# Patient Record
Sex: Male | Born: 1975 | Race: Black or African American | Hispanic: No | Marital: Single | Smoking: Never smoker
Health system: Southern US, Community
[De-identification: ages and names within clinical notes are randomized; demographics above are authoritative.]

## PROBLEM LIST (undated history)

## (undated) DIAGNOSIS — Z8619 Personal history of other infectious and parasitic diseases: Secondary | ICD-10-CM

## (undated) DIAGNOSIS — B351 Tinea unguium: Secondary | ICD-10-CM

## (undated) DIAGNOSIS — Z87898 Personal history of other specified conditions: Secondary | ICD-10-CM

## (undated) HISTORY — DX: Tinea unguium: B35.1

## (undated) HISTORY — PX: VASECTOMY: SHX75

## (undated) HISTORY — DX: Personal history of other infectious and parasitic diseases: Z86.19

## (undated) HISTORY — DX: Personal history of other specified conditions: Z87.898

## (undated) HISTORY — PX: ANKLE SURGERY: SHX546

---

## 2005-07-18 ENCOUNTER — Emergency Department (HOSPITAL_COMMUNITY): Admission: EM | Admit: 2005-07-18 | Discharge: 2005-07-18 | Payer: Self-pay | Admitting: Emergency Medicine

## 2006-02-27 ENCOUNTER — Emergency Department (HOSPITAL_COMMUNITY): Admission: EM | Admit: 2006-02-27 | Discharge: 2006-02-27 | Payer: Self-pay | Admitting: Emergency Medicine

## 2007-03-05 DIAGNOSIS — Z87898 Personal history of other specified conditions: Secondary | ICD-10-CM

## 2007-03-05 HISTORY — DX: Personal history of other specified conditions: Z87.898

## 2007-04-14 ENCOUNTER — Emergency Department (HOSPITAL_COMMUNITY): Admission: EM | Admit: 2007-04-14 | Discharge: 2007-04-14 | Payer: Self-pay | Admitting: Emergency Medicine

## 2007-08-15 ENCOUNTER — Emergency Department (HOSPITAL_COMMUNITY): Admission: EM | Admit: 2007-08-15 | Discharge: 2007-08-15 | Payer: Self-pay | Admitting: Emergency Medicine

## 2007-10-16 ENCOUNTER — Ambulatory Visit (HOSPITAL_COMMUNITY): Admission: RE | Admit: 2007-10-16 | Discharge: 2007-10-16 | Payer: Self-pay | Admitting: Family Medicine

## 2007-10-16 ENCOUNTER — Ambulatory Visit: Payer: Self-pay | Admitting: Family Medicine

## 2007-10-16 LAB — CONVERTED CEMR LAB
Albumin: 5.1 g/dL (ref 3.5–5.2)
BUN: 14 mg/dL (ref 6–23)
CO2: 27 meq/L (ref 19–32)
Calcium: 9.7 mg/dL (ref 8.4–10.5)
Chloride: 102 meq/L (ref 96–112)
Cholesterol: 158 mg/dL (ref 0–200)
Eosinophils Relative: 5 % (ref 0–5)
Glucose, Bld: 85 mg/dL (ref 70–99)
HCT: 47.2 % (ref 39.0–52.0)
HDL: 54 mg/dL (ref >39)
MCHC: 34.5 g/dL (ref 30.0–36.0)
MCV: 86.8 fL (ref 78.0–100.0)
Monocytes Absolute: 0.3 10*3/uL (ref 0.1–1.0)
Neutro Abs: 1.9 10*3/uL (ref 1.7–7.7)
Neutrophils Relative %: 49 % (ref 43–77)
Platelets: 185 10*3/uL (ref 150–400)
RBC: 5.44 M/uL (ref 4.22–5.81)
RDW: 12.4 % (ref 11.5–15.5)
Sodium: 139 meq/L (ref 135–145)
TSH: 0.394 microintl units/mL (ref 0.350–4.50)
Total Bilirubin: 3.1 mg/dL — ABNORMAL HIGH (ref 0.3–1.2)
Triglycerides: 51 mg/dL (ref <150)
VLDL: 10 mg/dL (ref 0–40)

## 2007-10-19 ENCOUNTER — Ambulatory Visit: Payer: Self-pay | Admitting: *Deleted

## 2008-08-05 ENCOUNTER — Emergency Department (HOSPITAL_COMMUNITY): Admission: EM | Admit: 2008-08-05 | Discharge: 2008-08-05 | Payer: Self-pay | Admitting: Emergency Medicine

## 2008-08-07 ENCOUNTER — Emergency Department (HOSPITAL_COMMUNITY): Admission: EM | Admit: 2008-08-07 | Discharge: 2008-08-07 | Payer: Self-pay | Admitting: Emergency Medicine

## 2010-06-11 LAB — DIFFERENTIAL
Basophils Absolute: 0 10*3/uL (ref 0.0–0.1)
Eosinophils Relative: 0 % (ref 0–5)
Monocytes Relative: 5 % (ref 3–12)

## 2010-06-11 LAB — COMPREHENSIVE METABOLIC PANEL
AST: 31 U/L (ref 0–37)
BUN: 11 mg/dL (ref 6–23)
CO2: 30 mEq/L (ref 19–32)
Calcium: 9.6 mg/dL (ref 8.4–10.5)
GFR calc Af Amer: >60 mL/min (ref >60)
Glucose, Bld: 95 mg/dL (ref 70–99)
Total Bilirubin: 1.8 mg/dL — ABNORMAL HIGH (ref 0.3–1.2)
Total Protein: 7.5 g/dL (ref 6.0–8.3)

## 2010-06-11 LAB — CBC
MCHC: 33.9 g/dL (ref 30.0–36.0)
Platelets: 167 10*3/uL (ref 150–400)
RBC: 5.01 MIL/uL (ref 4.22–5.81)

## 2010-10-28 ENCOUNTER — Emergency Department (HOSPITAL_COMMUNITY)
Admission: EM | Admit: 2010-10-28 | Discharge: 2010-10-28 | Disposition: A | Discharge status: Home / Self Care | Payer: Self-pay | Attending: Emergency Medicine | Admitting: Emergency Medicine

## 2010-10-28 DIAGNOSIS — M545 Low back pain, unspecified: Secondary | ICD-10-CM | POA: Insufficient documentation

## 2010-10-28 DIAGNOSIS — R1031 Right lower quadrant pain: Secondary | ICD-10-CM | POA: Insufficient documentation

## 2010-10-28 LAB — URINALYSIS, ROUTINE W REFLEX MICROSCOPIC
Ketones, ur: 15 mg/dL — AB
Protein, ur: NEGATIVE mg/dL
pH: 6.5 (ref 5.0–8.0)

## 2013-10-24 ENCOUNTER — Emergency Department (HOSPITAL_COMMUNITY)
Admission: EM | Admit: 2013-10-24 | Discharge: 2013-10-24 | Disposition: A | Discharge status: Home / Self Care | Payer: Self-pay | Attending: Emergency Medicine | Admitting: Emergency Medicine

## 2013-10-24 ENCOUNTER — Emergency Department (HOSPITAL_COMMUNITY): Payer: Self-pay

## 2013-10-24 ENCOUNTER — Encounter (HOSPITAL_COMMUNITY): Payer: Self-pay | Admitting: Emergency Medicine

## 2013-10-24 DIAGNOSIS — Z9852 Vasectomy status: Secondary | ICD-10-CM | POA: Insufficient documentation

## 2013-10-24 DIAGNOSIS — R109 Unspecified abdominal pain: Secondary | ICD-10-CM | POA: Insufficient documentation

## 2013-10-24 DIAGNOSIS — I861 Scrotal varices: Secondary | ICD-10-CM | POA: Insufficient documentation

## 2013-10-24 DIAGNOSIS — Z9089 Acquired absence of other organs: Secondary | ICD-10-CM | POA: Insufficient documentation

## 2013-10-24 DIAGNOSIS — Z791 Long term (current) use of non-steroidal anti-inflammatories (NSAID): Secondary | ICD-10-CM | POA: Insufficient documentation

## 2013-10-24 LAB — I-STAT CHEM 8, ED
BUN: 9 mg/dL (ref 6–23)
CHLORIDE: 106 meq/L (ref 96–112)
Calcium, Ion: 1.17 mmol/L (ref 1.12–1.23)
Creatinine, Ser: 1.1 mg/dL (ref 0.50–1.35)
Glucose, Bld: 75 mg/dL (ref 70–99)
HCT: 48 % (ref 39.0–52.0)
HEMOGLOBIN: 16.3 g/dL (ref 13.0–17.0)
POTASSIUM: 3.8 meq/L (ref 3.7–5.3)
SODIUM: 139 meq/L (ref 137–147)
TCO2: 28 mmol/L (ref 0–100)

## 2013-10-24 LAB — URINALYSIS, ROUTINE W REFLEX MICROSCOPIC
BILIRUBIN URINE: NEGATIVE
GLUCOSE, UA: NEGATIVE mg/dL
Hgb urine dipstick: NEGATIVE
KETONES UR: NEGATIVE mg/dL
LEUKOCYTES UA: NEGATIVE
NITRITE: NEGATIVE
Protein, ur: NEGATIVE mg/dL
Specific Gravity, Urine: 1.024 (ref 1.005–1.030)
UROBILINOGEN UA: 1 mg/dL (ref 0.0–1.0)
pH: 8 (ref 5.0–8.0)

## 2013-10-24 LAB — RPR

## 2013-10-24 LAB — HIV ANTIBODY (ROUTINE TESTING W REFLEX): HIV 1&2 Ab, 4th Generation: NONREACTIVE

## 2013-10-24 MED ORDER — TRAMADOL HCL 50 MG PO TABS: 50.0000 mg | ORAL_TABLET | Freq: Four times a day (QID) | ORAL | Status: DC | PRN

## 2013-10-24 NOTE — ED Notes (Signed)
PA Tiffany at the bedside.  

## 2013-10-24 NOTE — Discharge Instructions (Signed)
Scrotal Swelling Scrotal swelling may occur on one or both sides of the scrotum. Pain may also occur with swelling. Possible causes of scrotal swelling include:   Injury.  Infection.  An ingrown hair or abrasion in the area.  Repeated rubbing from tight-fitting underwear.  Poor hygiene.  A weakened area in the muscles around the groin (hernia). A hernia can allow abdominal contents to push into the scrotum.  Fluid around the testicle (hydrocele).  Enlarged vein around the testicle (varicocele).  Certain medical treatments or existing conditions.  A recent genital surgery or procedure.  The spermatic cord becomes twisted in the scrotum, which cuts off blood supply (testicular torsion).  Testicular cancer. HOME CARE INSTRUCTIONS Once the cause of your scrotal swelling has been determined, you may be asked to monitor your scrotum for any changes. The following actions may help to alleviate any discomfort you are experiencing:  Rest and limit activity until the swelling goes away. Lying down is the preferred position.  Put ice on the scrotum:  Put ice in a plastic bag.  Place a towel between your skin and the bag.  Leave the ice on for 20 minutes, 2-3 times a day for 1-2 days.  Place a rolled towel under the testicles for support.  Wear loose-fitting clothing or an athletic support cup for comfort.  Take all medicines as directed by your health care provider.  Perform a monthly self-exam of the scrotum and penis. Feel for changes. Ask your health care provider how to perform a monthly self-exam if you are unsure. SEEK MEDICAL CARE IF:  You have a sudden (acute) onset of pain that is persistent and not improving.  You notice a heavy feeling or fluid in the scrotum.  You have pain or burning while urinating.  You have blood in the urine or semen.  You feel a lump around the testicle.  You notice that one testicle is larger than the other (slight variation is  normal).  You have a persistent dull ache or pain in the groin or scrotum. SEEK IMMEDIATE MEDICAL CARE IF:  The pain does not go away or becomes severe.  You have a fever or shaking chills.  You have pain or vomiting that cannot be controlled.  You notice significant redness or swelling of one or both sides of the scrotum.  You experience redness spreading upward from your scrotum to your abdomen or downward from your scrotum to your thighs. MAKE SURE YOU:  Understand these instructions.  Will watch your condition.  Will get help right away if you are not doing well or get worse. Document Released: 03/23/2010 Document Revised: 10/21/2012 Document Reviewed: 07/23/2012 Exit[employer] Patient Information 2015 [employer]. This information is not intended to replace advice given to you by your health care provider. Make sure you discuss any questions you have with your health care provider.  

## 2013-10-24 NOTE — ED Notes (Signed)
Patient returned from US.

## 2013-10-24 NOTE — ED Provider Notes (Signed)
CSN: [id number]     Arrival date & time 10/24/13  1033 History   First MD Initiated Contact with Patient 10/24/13 1124     Chief Complaint  Patient presents with  . Abdominal Pain  . Groin Pain     (Consider location/radiation/quality/duration/timing/severity/associated sxs/prior Treatment) HPI  Patient to the ER with complaints of left lower suprapubic and testicular pain with swelling that started 4 days ago. He reports having a vasectomy 3 years ago and the pain being near the area of the surgery. It has been constant and does not wax of wane. He is sexually active but does not want to comment on if he uses protection. He has had urinary hesitancy. No penile discharge, rectal pain, hematuria. No fevers, nausea, vomiting, or diarrhea. Denies hx of testicular injury, torsion, orchitis or epididymis   History reviewed. No pertinent past medical history. Past Surgical History  Procedure Laterality Date  . Vasectomy     No family history on file. History  Substance Use Topics  . Smoking status: Never Smoker   . Smokeless tobacco: Not on file  . Alcohol Use: No    Review of Systems   Review of Systems  Gen: no weight loss, fevers, chills, night sweats  Eyes: no occular draining, occular pain,  No visual changes  Nose: no epistaxis or rhinorrhea  Mouth: no dental pain, no sore throat  Neck: no neck pain  Lungs: No hemoptysis. No wheezing or coughing CV:  No palpitations, dependent edema or orthopnea. No chest pain Abd: no diarrhea. No nausea or vomiting, No abdominal pain  GU: no dysuria or gross hematuria, + left testicular pain and swelling, urinary hesitancy. MSK:  No muscle weakness, No muscular pain Neuro: no headache, no focal neurologic deficits  Skin: no rash , no wounds Psyche: no complaints of depression or anxiety    Allergies  Review of patient's allergies indicates no known allergies.  Home Medications   Prior to Admission medications   Medication Sig  Start Date End Date Taking? Authorizing Provider  ibuprofen (ADVIL,MOTRIN) 200 MG tablet Take 400 mg by mouth every 6 (six) hours as needed for mild pain.   Yes Historical Provider, MD  traMADol (ULTRAM) 50 MG tablet Take 1 tablet (50 mg total) by mouth every 6 (six) hours as needed. 10/24/13   Ronnie Mallette Marilu Favre, PA-C   BP 115/72  Pulse 71  Temp(Src) 98.2 F (36.8 C) (Oral)  Resp 15  SpO2 98% Physical Exam  Nursing note and vitals reviewed. Constitutional: He appears well-developed and well-nourished. No distress.  HENT:  Head: Normocephalic and atraumatic.  Eyes: Pupils are equal, round, and reactive to light.  Neck: Normal range of motion. Neck supple.  Cardiovascular: Normal rate and regular rhythm.   Pulmonary/Chest: Effort normal.  Abdominal: Soft. Bowel sounds are normal. He exhibits no distension and no fluid wave. There is no hepatomegaly. There is no tenderness. There is no rebound and no CVA tenderness. No hernia. Hernia confirmed negative in the left inguinal area.    Genitourinary: Penis normal. Right testis shows no mass, no swelling and no tenderness. Right testis is descended. Cremasteric reflex is not absent on the right side. Left testis shows swelling and tenderness. Left testis shows no mass. Left testis is descended. Cremasteric reflex is not absent on the left side. Circumcised. No penile erythema. No discharge found.  Lymphadenopathy:       Left: No inguinal adenopathy present.  Neurological: He is alert.  Skin: Skin is warm  and dry.    ED Course  Procedures (including critical care time) Labs Review Labs Reviewed  URINALYSIS, ROUTINE W REFLEX MICROSCOPIC - Abnormal; Notable for the following:    APPearance CLOUDY (*)    All other components within normal limits  GC/CHLAMYDIA PROBE AMP  HIV ANTIBODY (ROUTINE TESTING)  RPR  I-STAT CHEM 8, ED    Imaging Review US Scrotum  10/24/2013   CLINICAL DATA:  Left testicular pain and swelling.  EXAM: SCROTAL  ULTRASOUND  DOPPLER ULTRASOUND OF THE TESTICLES  TECHNIQUE: Complete ultrasound examination of the testicles, epididymis, and other scrotal structures was performed. Color and spectral Doppler ultrasound were also utilized to evaluate blood flow to the testicles.  COMPARISON:  None.  FINDINGS: Right testicle  Measurements: 4.2 x 4.1 x 2.5 cm. No mass or microlithiasis visualized.  Left testicle  Measurements: 4.6 x 3.3 x 2.1 cm. No mass or microlithiasis visualized.  Right epididymis:  Normal in size and appearance.  Left epididymis:  Normal in size and appearance.  Hydrocele:  Small bilateral hydroceles are noted.  Varicocele:  Moderate left varicocele is noted.  Pulsed Doppler interrogation of both testes demonstrates low resistance arterial and venous waveforms bilaterally.  IMPRESSION: Moderate left varicocele is noted. Small bilateral hydroceles are noted. No evidence of testicular torsion or mass is noted.   Electronically Signed   By: Sabino Dick M.D.   On: 10/24/2013 13:18   [place] Art/ven Flow Abd Pelv Doppler  10/24/2013   CLINICAL DATA:  Left testicular pain and swelling.  EXAM: SCROTAL ULTRASOUND  DOPPLER ULTRASOUND OF THE TESTICLES  TECHNIQUE: Complete ultrasound examination of the testicles, epididymis, and other scrotal structures was performed. Color and spectral Doppler ultrasound were also utilized to evaluate blood flow to the testicles.  COMPARISON:  None.  FINDINGS: Right testicle  Measurements: 4.2 x 4.1 x 2.5 cm. No mass or microlithiasis visualized.  Left testicle  Measurements: 4.6 x 3.3 x 2.1 cm. No mass or microlithiasis visualized.  Right epididymis:  Normal in size and appearance.  Left epididymis:  Normal in size and appearance.  Hydrocele:  Small bilateral hydroceles are noted.  Varicocele:  Moderate left varicocele is noted.  Pulsed Doppler interrogation of both testes demonstrates low resistance arterial and venous waveforms bilaterally.  IMPRESSION: Moderate left varicocele is  noted. Small bilateral hydroceles are noted. No evidence of testicular torsion or mass is noted.   Electronically Signed   By: Sabino Dick M.D.   On: 10/24/2013 13:18     EKG Interpretation None      MDM   Final diagnoses:  Varicocele present on ultrasound of scrotum    US shows Varicocele but without any signs of infection. He has a urologist who he plans to see tomorrow. Will tx for pain and have him follow-up. GC cultures, HIV and RPR pending. Pt declined pain medication during his visit as he reports his discomfort is mild.  38 y.o.Rebecca Duzan's evaluation in the Emergency Department is complete. It has been determined that no acute conditions requiring further emergency intervention are present at this time. The patient/guardian have been advised of the diagnosis and plan. We have discussed signs and symptoms that warrant return to the ED, such as changes or worsening in symptoms.  Vital signs are stable at discharge. Filed Vitals:   10/24/13 1330  BP: 115/72  Pulse: 71  Temp:   Resp: 15    Patient/guardian has voiced understanding and agreed to follow-up with the PCP or specialist.  Linus Mako, PA-C 10/24/13 1343

## 2013-10-24 NOTE — ED Provider Notes (Signed)
Medical screening examination/treatment/procedure(s) were performed by non-physician practitioner and as supervising physician I was immediately available for consultation/collaboration.   EKG Interpretation None       Threasa Beards, MD 10/24/13 1351

## 2013-10-24 NOTE — ED Notes (Signed)
Pt is here with left lower abdominal pain that radiates into testicles, reports testicle tenderness.  Pt feels like he needs to urinate but does not.  No back pain

## 2013-10-26 LAB — GC/CHLAMYDIA PROBE AMP
CT PROBE, AMP APTIMA: NEGATIVE
GC PROBE AMP APTIMA: NEGATIVE

## 2014-05-15 ENCOUNTER — Emergency Department (HOSPITAL_COMMUNITY)
Admission: EM | Admit: 2014-05-15 | Discharge: 2014-05-15 | Disposition: A | Discharge status: Home / Self Care | Payer: Self-pay | Attending: Emergency Medicine | Admitting: Emergency Medicine

## 2014-05-15 ENCOUNTER — Encounter (HOSPITAL_COMMUNITY): Payer: Self-pay | Admitting: *Deleted

## 2014-05-15 DIAGNOSIS — Y998 Other external cause status: Secondary | ICD-10-CM | POA: Insufficient documentation

## 2014-05-15 DIAGNOSIS — S3992XA Unspecified injury of lower back, initial encounter: Secondary | ICD-10-CM | POA: Insufficient documentation

## 2014-05-15 DIAGNOSIS — Y9389 Activity, other specified: Secondary | ICD-10-CM | POA: Insufficient documentation

## 2014-05-15 DIAGNOSIS — S4990XA Unspecified injury of shoulder and upper arm, unspecified arm, initial encounter: Secondary | ICD-10-CM | POA: Insufficient documentation

## 2014-05-15 DIAGNOSIS — Z79899 Other long term (current) drug therapy: Secondary | ICD-10-CM | POA: Insufficient documentation

## 2014-05-15 DIAGNOSIS — S199XXA Unspecified injury of neck, initial encounter: Secondary | ICD-10-CM | POA: Insufficient documentation

## 2014-05-15 DIAGNOSIS — M436 Torticollis: Secondary | ICD-10-CM

## 2014-05-15 DIAGNOSIS — Y92410 Unspecified street and highway as the place of occurrence of the external cause: Secondary | ICD-10-CM | POA: Insufficient documentation

## 2014-05-15 MED ORDER — NAPROXEN 500 MG PO TABS: 500.0000 mg | ORAL_TABLET | Freq: Two times a day (BID) | ORAL | Status: DC

## 2014-05-15 MED ORDER — CYCLOBENZAPRINE HCL 10 MG PO TABS: 10.0000 mg | ORAL_TABLET | Freq: Two times a day (BID) | ORAL | Status: DC | PRN
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
## 2014-06-14 ENCOUNTER — Other Ambulatory Visit: Payer: Self-pay | Admitting: Family Medicine

## 2014-06-14 DIAGNOSIS — R229 Localized swelling, mass and lump, unspecified: Principal | ICD-10-CM

## 2014-06-14 DIAGNOSIS — IMO0002 Reserved for concepts with insufficient information to code with codable children: Secondary | ICD-10-CM

## 2014-07-01 ENCOUNTER — Ambulatory Visit
Admission: RE | Admit: 2014-07-01 | Discharge: 2014-07-01 | Disposition: A | Discharge status: Home / Self Care | Payer: Managed Care, Other (non HMO) | Source: Ambulatory Visit | Attending: Family Medicine | Admitting: Family Medicine

## 2014-07-01 DIAGNOSIS — IMO0002 Reserved for concepts with insufficient information to code with codable children: Secondary | ICD-10-CM

## 2014-07-01 DIAGNOSIS — R229 Localized swelling, mass and lump, unspecified: Principal | ICD-10-CM

## 2014-10-21 ENCOUNTER — Encounter: Payer: Self-pay | Admitting: Family Medicine

## 2014-10-21 ENCOUNTER — Ambulatory Visit (INDEPENDENT_AMBULATORY_CARE_PROVIDER_SITE_OTHER): Payer: Managed Care, Other (non HMO) | Admitting: Family Medicine

## 2014-10-21 VITALS — BP 116/68 | HR 76 | Ht 70.0 in | Wt 133.8 lb

## 2014-10-21 DIAGNOSIS — Z113 Encounter for screening for infections with a predominantly sexual mode of transmission: Secondary | ICD-10-CM

## 2014-10-21 DIAGNOSIS — M542 Cervicalgia: Secondary | ICD-10-CM

## 2014-10-21 DIAGNOSIS — R5383 Other fatigue: Secondary | ICD-10-CM | POA: Diagnosis not present

## 2014-10-21 DIAGNOSIS — B351 Tinea unguium: Secondary | ICD-10-CM | POA: Diagnosis not present

## 2014-10-21 DIAGNOSIS — Z0000 Encounter for general adult medical examination without abnormal findings: Secondary | ICD-10-CM

## 2014-10-21 LAB — POCT URINALYSIS DIPSTICK
Bilirubin, UA: NEGATIVE
Blood, UA: NEGATIVE
GLUCOSE UA: NEGATIVE
Ketones, UA: NEGATIVE
LEUKOCYTES UA: NEGATIVE
Nitrite, UA: NEGATIVE
PROTEIN UA: NEGATIVE
SPEC GRAV UA: 1.025
UROBILINOGEN UA: NEGATIVE
pH, UA: 6.5

## 2014-10-21 NOTE — Progress Notes (Signed)
Subjective:    Patient ID: Bryan Ruiz, male    DOB: [birth year], 39 y.o.   MRN: [id number]  HPI He is new to this practice and is here to establish care and for a complete physical exam. He has multiple complaints. He has a hard nodule to his right forehead that he states has been there for many years. He states this area bothers him when he is wearing hats. He states it has stayed about the same size over the past few years, or maybe slightly bigger. He also complains of right lateral neck stiffness and feeling sore for several months. He states he has seen a provider for this problem in the past and was told it was muscle tightness. He has not tried any treatment at home for this. He states he has felt somewhat more tired over the past 3 weeks and that on one occasion at work he felt "out of it". States he could've been overheated or may be dehydrated. He reports having occasional headaches but denies dizziness, syncope, visual disturbances, numbness, weakness, tingling. Also denies chest pain, palpitations, shortness of breath, abdominal pain, or GI complaints.  Another complaint he has is a long history of toenail fungus to all toes on both feet. He states he has been on oral Lamisil for this problem and that it seemed to clear up a couple of years ago. States the problem has returned and would like to know his treatment options for this. He also states he has seen a podiatrist in the past for the same problem.  He states he is up-to-date on all immunizations and will receive his flu shot at work since it is free there. We do not currently have his immunization records. We will need to get these from [facility] [place]. He works at [employer]. He is currently single and is not currently sexually active. States he has 5 children, 4 girls and one boy. He has had a vasectomy. He would like to be checked for HIV and syphilis today. He denies penile discharge, dysuria, rash or  lesions. He denies smoking, drinking, drug use. Reports a happy mood and feeling upbeat most of the time. He states he is very active at work but does not exercise often. Reports recent visit to the dentist and he needs to have fillings replaced. He also visits his eye doctor.  Reviewed his past medical history, social and family history.  Review of Systems Pertinent positives and negatives in history of present illness, otherwise systems negative.    Objective:   Physical Exam BP 116/68 mmHg  Pulse 76  Ht 5\' 10"  (1.778 m)  Wt 133 lb 12.8 oz (60.691 kg)  BMI 19.20 kg/m2  General Appearance:    Alert, cooperative, no distress, appears stated age  Head:    Normocephalic, a 1.5 cm hard smooth round nodule to right forehead just below hair line, atraumatic  Eyes:    PERRL, conjunctiva/corneas clear, EOM's intact, fundi    benign  Ears:    Normal TM's and external ear canals  Nose:   Nares normal, mucosa normal, no drainage or sinus   tenderness  Throat:   Lips, mucosa, and tongue normal; teeth with some silver fillings and gums normal  Neck:   Supple, no lymphadenopathy;  thyroid:  no   enlargement/tenderness/nodules  Back:    Spine nontender, no curvature, ROM normal, no CVA     tenderness  Lungs:     Clear to  auscultation bilaterally without wheezes, rales or     ronchi; respirations unlabored  Chest Wall:    No tenderness or deformity   Heart:    Regular rate and rhythm, S1 and S2 normal, no murmur, rub   or gallop  Breast Exam:    No chest wall tenderness, masses or gynecomastia  Abdomen:     Soft, non-tender, nondistended, normoactive bowel sounds,    no masses, no hepatosplenomegaly  Genitalia:    Patient declined  Rectal:   Deferred due to age <40 and lack of symptoms  Extremities:   No clubbing, cyanosis or edema  Pulses:   2+ and symmetric all extremities  Skin:   Skin color, texture, turgor normal, no rashes or lesions, multiple tattoos   Lymph nodes:   Cervical,  supraclavicular, and axillary nodes normal  Neurologic:   CNII-XII intact, normal strength, sensation and gait; reflexes 2+ and symmetric throughout          Psych:   Normal mood, affect, hygiene and grooming.    Urinalysis dipstick negative      Assessment & Plan:  Routine general medical examination at a health care facility - Plan: CBC with Differential/Platelet, Comprehensive metabolic panel, Lipid panel, POCT urinalysis dipstick  Onychomycosis  Screening for STD (sexually transmitted disease) - Plan: HIV antibody, RPR  Other fatigue - Plan: TSH  Neck ache  Advised patient that since he ate this morning that he should return when it's convenient one day next week to have fasting labs drawn. He did not mention that he has already had a CAT scan of his head in order to determine what exactly the hard nodule is on his forehead. 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 Will address this with the patient at his follow-up appointment. Encouraged him to try heat pack for 10-15 minutes and then try slow controlled stretches for his neck, I demonstrated these stretches in the office. He can try ibuprofen 800 mg for his neck discomfort. We will discuss his options to treat onychomycosis at his follow-up appointment once I have his liver function test results. Advised him to eat regular meals, no skipping meals and to stay hydrated since he is working in a hot environment. Also encouraged him to continue to be active and get 150 minutes of physical activity each week. Recommended that I check for hernias today by doing a genital exam, however the patient refused. Will follow-up pending labs

## 2014-10-21 NOTE — Patient Instructions (Signed)
Preventative Care for Adults, Male       REGULAR HEALTH EXAMS:  A routine yearly physical is a good way to check in with your primary care provider about your health and preventive screening. It is also an opportunity to share updates about your health and any concerns you have, and receive a thorough all-over exam.   Most health insurance companies pay for at least some preventative services.  Check with your health plan for specific coverages.  WHAT PREVENTATIVE SERVICES DO MEN NEED?  Adult men should have their weight and blood pressure checked regularly.   Men age 35 and older should have their cholesterol levels checked regularly.  Beginning at age 50 and continuing to age 75, men should be screened for colorectal cancer.  Certain people should may need continued testing until age 85.  Other cancer screening may include exams for testicular and prostate cancer.  Updating vaccinations is part of preventative care.  Vaccinations help protect against diseases such as the flu.  Lab tests are generally done as part of preventative care to screen for anemia and blood disorders, to screen for problems with the kidneys and liver, to screen for bladder problems, to check blood sugar, and to check your cholesterol level.  Preventative services generally include counseling about diet, exercise, avoiding tobacco, drugs, excessive alcohol consumption, and sexually transmitted infections.    GENERAL RECOMMENDATIONS FOR GOOD HEALTH:  Healthy diet:  Eat a variety of foods, including fruit, vegetables, animal or vegetable protein, such as meat, fish, chicken, and eggs, or beans, lentils, tofu, and grains, such as rice.  Drink plenty of water daily.  Decrease saturated fat in the diet, avoid lots of red meat, processed foods, sweets, fast foods, and fried foods.  Exercise:  Aerobic exercise helps maintain good heart health. At least 30-40 minutes of moderate-intensity exercise is recommended.  For example, a brisk walk that increases your heart rate and breathing. This should be done on most days of the week.   Find a type of exercise or a variety of exercises that you enjoy so that it becomes a part of your daily life.  Examples are running, walking, swimming, water aerobics, and biking.  For motivation and support, explore group exercise such as aerobic class, spin class, Zumba, Yoga,or  martial arts, etc.    Set exercise goals for yourself, such as a certain weight goal, walk or run in a race such as a 5k walk/run.  Speak to your primary care provider about exercise goals.  Disease prevention:  If you smoke or chew tobacco, find out from your caregiver how to quit. It can literally save your life, no matter how long you have been a tobacco user. If you do not use tobacco, never begin.   Maintain a healthy diet and normal weight. Increased weight leads to problems with blood pressure and diabetes.   The Body Mass Index or BMI is a way of measuring how much of your body is fat. Having a BMI above 27 increases the risk of heart disease, diabetes, hypertension, stroke and other problems related to obesity. Your caregiver can help determine your BMI and based on it develop an exercise and dietary program to help you achieve or maintain this important measurement at a healthful level.  High blood pressure causes heart and blood vessel problems.  Persistent high blood pressure should be treated with medicine if weight loss and exercise do not work.   Fat and cholesterol leaves deposits in your arteries   that can block them. This causes heart disease and vessel disease elsewhere in your body.  If your cholesterol is found to be high, or if you have heart disease or certain other medical conditions, then you may need to have your cholesterol monitored frequently and be treated with medication.   Ask if you should have a stress test if your history suggests this. A stress test is a test done on  a treadmill that looks for heart disease. This test can find disease prior to there being a problem.  Avoid drinking alcohol in excess (more than two drinks per day).  Avoid use of street drugs. Do not share needles with anyone. Ask for professional help if you need assistance or instructions on stopping the use of alcohol, cigarettes, and/or drugs.  Brush your teeth twice a day with fluoride toothpaste, and floss once a day. Good oral hygiene prevents tooth decay and gum disease. The problems can be painful, unattractive, and can cause other health problems. Visit your dentist for a routine oral and dental check up and preventive care every 6-12 months.   Look at your skin regularly.  Use a mirror to look at your back. Notify your caregivers of changes in moles, especially if there are changes in shapes, colors, a size larger than a pencil eraser, an irregular border, or development of new moles.  Safety:  Use seatbelts 100% of the time, whether driving or as a passenger.  Use safety devices such as hearing protection if you work in environments with loud noise or significant background noise.  Use safety glasses when doing any work that could send debris in to the eyes.  Use a helmet if you ride a bike or motorcycle.  Use appropriate safety gear for contact sports.  Talk to your caregiver about gun safety.  Use sunscreen with a SPF (or skin protection factor) of 15 or greater.  Lighter skinned people are at a greater risk of skin cancer. Don't forget to also wear sunglasses in order to protect your eyes from too much damaging sunlight. Damaging sunlight can accelerate cataract formation.   Practice safe sex. Use condoms. Condoms are used for birth control and to help reduce the spread of sexually transmitted infections (or STIs).  Some of the STIs are gonorrhea (the clap), chlamydia, syphilis, trichomonas, herpes, HPV (human papilloma virus) and HIV (human immunodeficiency virus) which causes AIDS.  The herpes, HIV and HPV are viral illnesses that have no cure. These can result in disability, cancer and death.   Keep carbon monoxide and smoke detectors in your home functioning at all times. Change the batteries every 6 months or use a model that plugs into the wall.   Vaccinations:  Stay up to date with your tetanus shots and other required immunizations. You should have a booster for tetanus every 10 years. Be sure to get your flu shot every year, since 5%-20% of the [place] population comes down with the flu. The flu vaccine changes each year, so being vaccinated once is not enough. Get your shot in the fall, before the flu season peaks.   Other vaccines to consider:  Pneumococcal vaccine to protect against certain types of pneumonia.  This is normally recommended for adults age 65 or older.  However, adults younger than 37 years old with certain underlying conditions such as diabetes, heart or lung disease should also receive the vaccine.  Shingles vaccine to protect against Varicella Zoster if you are older than age 60, or younger   than 37 years old with certain underlying illness.  Hepatitis A vaccine to protect against a form of infection of the liver by a virus acquired from food.  Hepatitis B vaccine to protect against a form of infection of the liver by a virus acquired from blood or body fluids, particularly if you work in health care.  If you plan to travel internationally, check with your local health department for specific vaccination recommendations.  Cancer Screening:  Most routine colon cancer screening begins at the age of 50. On a yearly basis, doctors may provide special easy to use take-home tests to check for hidden blood in the stool. Sigmoidoscopy or colonoscopy can detect the earliest forms of colon cancer and is life saving. These tests use a small camera at the end of a tube to directly examine the colon. Speak to your caregiver about this at age 50, when routine  screening begins (and is repeated every 5 years unless early forms of pre-cancerous polyps or small growths are found).   At the age of 50 men usually start screening for prostate cancer every year. Screening may begin at a younger age for those with higher risk. Those at higher risk include African-Americans or having a family history of prostate cancer. There are two types of tests for prostate cancer:   Prostate-specific antigen (PSA) testing. Recent studies raise questions about prostate cancer using PSA and you should discuss this with your caregiver.   Digital rectal exam (in which your doctor's lubricated and gloved finger feels for enlargement of the prostate through the anus).   Screening for testicular cancer.  Do a monthly exam of your testicles. Gently roll each testicle between your thumb and fingers, feeling for any abnormal lumps. The best time to do this is after a hot shower or bath when the tissues are looser. Notify your caregivers of any lumps, tenderness or changes in size or shape immediately.     

## 2014-10-24 ENCOUNTER — Other Ambulatory Visit: Payer: Self-pay

## 2014-10-28 ENCOUNTER — Telehealth: Payer: Self-pay | Admitting: Family Medicine

## 2014-10-28 NOTE — Telephone Encounter (Signed)
Records received from previous provider. Please note what needs to be abstracted or scanned.

## 2014-10-31 ENCOUNTER — Encounter: Payer: Self-pay | Admitting: Family Medicine

## 2014-11-02 ENCOUNTER — Other Ambulatory Visit: Payer: Managed Care, Other (non HMO)

## 2014-11-02 DIAGNOSIS — R5383 Other fatigue: Secondary | ICD-10-CM

## 2014-11-02 DIAGNOSIS — Z0000 Encounter for general adult medical examination without abnormal findings: Secondary | ICD-10-CM

## 2014-11-02 DIAGNOSIS — Z113 Encounter for screening for infections with a predominantly sexual mode of transmission: Secondary | ICD-10-CM

## 2014-11-02 LAB — CBC WITH DIFFERENTIAL/PLATELET
Basophils Absolute: 0 10*3/uL (ref 0.0–0.1)
Basophils Relative: 1 % (ref 0–1)
Eosinophils Absolute: 0.4 10*3/uL (ref 0.0–0.7)
Eosinophils Relative: 10 % — ABNORMAL HIGH (ref 0–5)
HEMATOCRIT: 44.5 % (ref 39.0–52.0)
HEMOGLOBIN: 15.1 g/dL (ref 13.0–17.0)
LYMPHS ABS: 1.6 10*3/uL (ref 0.7–4.0)
LYMPHS PCT: 41 % (ref 12–46)
MCH: 29.8 pg (ref 26.0–34.0)
MCHC: 33.9 g/dL (ref 30.0–36.0)
MCV: 87.9 fL (ref 78.0–100.0)
MONOS PCT: 9 % (ref 3–12)
MPV: 10.8 fL (ref 8.6–12.4)
Monocytes Absolute: 0.3 10*3/uL (ref 0.1–1.0)
NEUTROS ABS: 1.5 10*3/uL — AB (ref 1.7–7.7)
NEUTROS PCT: 39 % — AB (ref 43–77)
Platelets: 201 10*3/uL (ref 150–400)
RBC: 5.06 MIL/uL (ref 4.22–5.81)
RDW: 13.1 % (ref 11.5–15.5)
WBC: 3.8 10*3/uL — ABNORMAL LOW (ref 4.0–10.5)

## 2014-11-02 LAB — COMPREHENSIVE METABOLIC PANEL
ALBUMIN: 4.5 g/dL (ref 3.6–5.1)
ALT: 13 U/L (ref 9–46)
AST: 15 U/L (ref 10–40)
Alkaline Phosphatase: 28 U/L — ABNORMAL LOW (ref 40–115)
BUN: 12 mg/dL (ref 7–25)
CALCIUM: 9.5 mg/dL (ref 8.6–10.3)
CHLORIDE: 101 mmol/L (ref 98–110)
CO2: 26 mmol/L (ref 20–31)
Creat: 0.91 mg/dL (ref 0.60–1.35)
Glucose, Bld: 88 mg/dL (ref 65–99)
POTASSIUM: 4.1 mmol/L (ref 3.5–5.3)
Sodium: 137 mmol/L (ref 135–146)
TOTAL PROTEIN: 6.9 g/dL (ref 6.1–8.1)
Total Bilirubin: 2 mg/dL — ABNORMAL HIGH (ref 0.2–1.2)

## 2014-11-02 LAB — LIPID PANEL
CHOL/HDL RATIO: 2.9 ratio (ref <=5.0)
Cholesterol: 113 mg/dL — ABNORMAL LOW (ref 125–200)
HDL: 39 mg/dL — ABNORMAL LOW (ref >=40)
LDL Cholesterol: 61 mg/dL (ref <130)
TRIGLYCERIDES: 63 mg/dL (ref <150)
VLDL: 13 mg/dL (ref <30)

## 2014-11-03 ENCOUNTER — Other Ambulatory Visit: Payer: Managed Care, Other (non HMO)

## 2014-11-03 LAB — TSH: TSH: 0.462 u[IU]/mL (ref 0.350–4.500)

## 2014-11-03 LAB — HIV ANTIBODY (ROUTINE TESTING W REFLEX): HIV: NONREACTIVE

## 2014-11-03 LAB — RPR

## 2014-11-10 ENCOUNTER — Encounter: Payer: Self-pay | Admitting: Family Medicine

## 2014-11-10 ENCOUNTER — Ambulatory Visit (INDEPENDENT_AMBULATORY_CARE_PROVIDER_SITE_OTHER): Payer: Managed Care, Other (non HMO) | Admitting: Family Medicine

## 2014-11-10 VITALS — BP 112/62 | HR 68 | Temp 97.2°F | Wt 136.6 lb

## 2014-11-10 DIAGNOSIS — B356 Tinea cruris: Secondary | ICD-10-CM

## 2014-11-10 DIAGNOSIS — J019 Acute sinusitis, unspecified: Secondary | ICD-10-CM | POA: Diagnosis not present

## 2014-11-10 NOTE — Progress Notes (Signed)
   Subjective:    Patient ID: Bryan Ruiz, male    DOB: 09-Sep-1975, 39 y.o.   MRN: [id number]  HPI He is here for a rash to his groin area that he has had for most of the summer. He states he has been using over-the-counter Lamisil sporadically. He states he works in a hot environment and when he starts sweating the area itches. He states he has problems with this almost every summer. He also complains of runny nose, congestion, productive cough, sinus pressure for the past 5 days. He states he also had chills for the past 2 nights. He feels like he is getting worse. He has been taking Mucinex and NyQuil. Denies ear pain, sore throat, nausea, vomiting, diarrhea.   Review of Systems Pertinent positives and negatives in the history of present illness.     Objective:   Physical Exam  Constitutional: He appears well-developed and well-nourished. No distress.  HENT:  Head: Normocephalic.  Right Ear: Tympanic membrane and external ear normal.  Left Ear: Tympanic membrane and external ear normal.  Nose: Mucosal edema and rhinorrhea present. Right sinus exhibits maxillary sinus tenderness.  Mouth/Throat: Oropharynx is clear and moist. No oropharyngeal exudate.  Neck: Normal range of motion. Neck supple.  Pulmonary/Chest: Effort normal and breath sounds normal.  Lymphadenopathy:    He has no cervical adenopathy.  Skin: Skin is warm and dry. Rash noted.  Symmetric large patches of mildly erythematous rash to bilateral folds of groin, right worse than left, that extends down to bilateral thighs. No moisture or exudate.           Assessment & Plan:  Acute rhinosinusitis  Tinea cruris  He will use the sample of nasal saline spray given, he can continue treating his symptoms with Mucinex, Tylenol or ibuprofen. Discussed that he might want to also try humidifier at night. Antibiotic will be sent to his pharmacy and encouraged him to give this illness a couple of more days and see if he  starts to improve. If he gets worse tonight or tomorrow I advised him to go and pick up the antibiotic and start taking it. He will let us know if he is not feeling better in about a week.  Discussed that he should use Lamisil to his groin twice daily for the next 2 weeks minimum. Discussed wearing white underwear to bed with the Lamisil on it and then wear that same underwear the next day with another application of the medication. He will let us know if this is not getting any better.

## 2014-11-10 NOTE — Patient Instructions (Signed)
You've been having these cold symptoms for 5 days so it's difficult to determine if it's a virus or bacterial infection. I'm going to call in an antibiotic if you're not better in the next couple of days or if you get worse to get up and start taking it.  Use the saline nasal spray and try taking the Zyrtec in the evening because it might make you sleepy. He could also try using a humidifier at night.   Wear white cotton underwear and use Lamisil twice daily for the next 2 weeks.     Upper Respiratory Infection, Adult An upper respiratory infection (URI) is also sometimes known as the common cold. The upper respiratory tract includes the nose, sinuses, throat, trachea, and bronchi. Bronchi are the airways leading to the lungs. Most people improve within 1 week, but symptoms can last up to 2 weeks. A residual cough may last even longer.  CAUSES Many different viruses can infect the tissues lining the upper respiratory tract. The tissues become irritated and inflamed and often become very moist. Mucus production is also common. A cold is contagious. You can easily spread the virus to others by oral contact. This includes kissing, sharing a glass, coughing, or sneezing. Touching your mouth or nose and then touching a surface, which is then touched by another person, can also spread the virus. SYMPTOMS  Symptoms typically develop 1 to 3 days after you come in contact with a cold virus. Symptoms vary from person to person. They may include:  Runny nose.  Sneezing.  Nasal congestion.  Sinus irritation.  Sore throat.  Loss of voice (laryngitis).  Cough.  Fatigue.  Muscle aches.  Loss of appetite.  Headache.  Low-grade fever. DIAGNOSIS  You might diagnose your own cold based on familiar symptoms, since most people get a cold 2 to 3 times a year. Your caregiver can confirm this based on your exam. Most importantly, your caregiver can check that your symptoms are not due to another  disease such as strep throat, sinusitis, pneumonia, asthma, or epiglottitis. Blood tests, throat tests, and X-rays are not necessary to diagnose a common cold, but they may sometimes be helpful in excluding other more serious diseases. Your caregiver will decide if any further tests are required. RISKS AND COMPLICATIONS  You may be at risk for a more severe case of the common cold if you smoke cigarettes, have chronic heart disease (such as heart failure) or lung disease (such as asthma), or if you have a weakened immune system. The very young and very old are also at risk for more serious infections. Bacterial sinusitis, middle ear infections, and bacterial pneumonia can complicate the common cold. The common cold can worsen asthma and chronic obstructive pulmonary disease (COPD). Sometimes, these complications can require emergency medical care and may be life-threatening. PREVENTION  The best way to protect against getting a cold is to practice good hygiene. Avoid oral or hand contact with people with cold symptoms. Wash your hands often if contact occurs. There is no clear evidence that vitamin C, vitamin E, echinacea, or exercise reduces the chance of developing a cold. However, it is always recommended to get plenty of rest and practice good nutrition. TREATMENT  Treatment is directed at relieving symptoms. There is no cure. Antibiotics are not effective, because the infection is caused by a virus, not by bacteria. Treatment may include:  Increased fluid intake. Sports drinks offer valuable electrolytes, sugars, and fluids.  Breathing heated mist or steam (vaporizer  or shower).  Eating chicken soup or other clear broths, and maintaining good nutrition.  Getting plenty of rest.  Using gargles or lozenges for comfort.  Controlling fevers with ibuprofen or acetaminophen as directed by your caregiver.  Increasing usage of your inhaler if you have asthma. Zinc gel and zinc lozenges, taken in  the first 24 hours of the common cold, can shorten the duration and lessen the severity of symptoms. Pain medicines may help with fever, muscle aches, and throat pain. A variety of non-prescription medicines are available to treat congestion and runny nose. Your caregiver can make recommendations and may suggest nasal or lung inhalers for other symptoms.  HOME CARE INSTRUCTIONS   Only take over-the-counter or prescription medicines for pain, discomfort, or fever as directed by your caregiver.  Use a warm mist humidifier or inhale steam from a shower to increase air moisture. This may keep secretions moist and make it easier to breathe.  Drink enough water and fluids to keep your urine clear or pale yellow.  Rest as needed.  Return to work when your temperature has returned to normal or as your caregiver advises. You may need to stay home longer to avoid infecting others. You can also use a face mask and careful hand washing to prevent spread of the virus. SEEK MEDICAL CARE IF:   After the first few days, you feel you are getting worse rather than better.  You need your caregiver's advice about medicines to control symptoms.  You develop chills, worsening shortness of breath, or brown or red sputum. These may be signs of pneumonia.  You develop yellow or brown nasal discharge or pain in the face, especially when you bend forward. These may be signs of sinusitis.  You develop a fever, swollen neck glands, pain with swallowing, or white areas in the back of your throat. These may be signs of strep throat. SEEK IMMEDIATE MEDICAL CARE IF:   You have a fever.  You develop severe or persistent headache, ear pain, sinus pain, or chest pain.  You develop wheezing, a prolonged cough, cough up blood, or have a change in your usual mucus (if you have chronic lung disease).  You develop sore muscles or a stiff neck. Document Released: 08/14/2000 Document Revised: 05/13/2011 Document Reviewed:  05/26/2013 [facility] Patient Information 2015 [place], [place]. This information is not intended to replace advice given to you by your health care provider. Make sure you discuss any questions you have with your health care provider.

## 2014-11-11 ENCOUNTER — Encounter: Payer: Self-pay | Admitting: Family Medicine

## 2014-11-11 ENCOUNTER — Other Ambulatory Visit: Payer: Self-pay | Admitting: Family Medicine

## 2014-11-11 MED ORDER — AMOXICILLIN 875 MG PO TABS: 875.0000 mg | ORAL_TABLET | Freq: Two times a day (BID) | ORAL | Status: DC

## 2014-11-15 ENCOUNTER — Encounter: Payer: Self-pay | Admitting: Medical

## 2014-11-15 ENCOUNTER — Ambulatory Visit (INDEPENDENT_AMBULATORY_CARE_PROVIDER_SITE_OTHER): Payer: Self-pay | Admitting: Medical

## 2014-11-15 ENCOUNTER — Telehealth: Payer: Self-pay | Admitting: Internal Medicine

## 2014-11-15 VITALS — BP 110/70 | HR 70 | Temp 98.0°F | Resp 18 | Ht 70.0 in | Wt 135.4 lb

## 2014-11-15 DIAGNOSIS — Z0289 Encounter for other administrative examinations: Secondary | ICD-10-CM

## 2014-11-15 LAB — POCT URINALYSIS DIPSTICK
BILIRUBIN UA: NEGATIVE
Blood, UA: NEGATIVE
Glucose, UA: NEGATIVE
Ketones, UA: NEGATIVE
LEUKOCYTES UA: NEGATIVE
NITRITE UA: NEGATIVE
PH UA: 6
Spec Grav, UA: >=1.03
Urobilinogen, UA: NEGATIVE

## 2014-11-15 MED ORDER — TERBINAFINE HCL 250 MG PO TABS: 250.0000 mg | ORAL_TABLET | Freq: Every day | ORAL | Status: DC

## 2014-11-15 NOTE — Progress Notes (Signed)
Commercial Driver Medical Examination   Bryan Ruiz is a 39 y.o. male who presents today for a commercial driver fitness determination physical exam.  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  No prior disqualification.   Had forgot about his renewal in 2014, but he doesn't drive currently for work, so just wants to keep this active.   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   Medical care team includes:  Harland Dingwall, just established here for primary care recently.  The patient reports no problems.  Review of Systems A comprehensive review of systems was reviewed and noted as below:  Eye: - corrective lenses, -lasik surgery or other eye surgery, -glaucoma, -cataracts, -macular degeneration, -monocular vision, -medication for eye condition, -blurred vision,   Ears: -hearing problems, - hearing aids, -ear pain, -ear drainage, -ear fullness, -tinnitus, -recurrent ear infection, -previous ear surgery, - vertigo, -menieres disease  Endocrine: -polydipsia, -polyuria, -weight loss, -fainting, -dizziness, - altered or loss of consciousness, -hypoglycemia  Cardiovascular: -heart disease, -CHF, -heart attack, -cardiac stents, -bypass surgery, -other heart surgery, -hypertension, -blood clots, -pacemaker, -medications for heart condition, -chest pain, -SOB, -palpitations, -fainting, -dizziness, -dyspnea  Respiratory: -asthma, -COPD, other lung disease, -smoker, -chest tightness, - wheezing, -snoring, -daytime sleepiness, -sleep apnea or uses CPAP, -narcolepsy, -sleeping disorder  Allergy: -uncontrollable sneezing or allergy symptoms  Musculoskeletal: -missing body parts, -muscle disease, -bone disease, -spine injury, -low back pain, -medication for joints, bones, muscles or pain, -physical limitations, -joint pain, -neck pain, -limitations of neck ROM, -back surgery, orthopedic surgery, -rheumatologic condition, -gout  Neurologic: -neurologic disease, -dementia, -seizures, -parkinsons,  -tremor, -memory problems, -weakness, -numbness, -tingling, -medication for neurologic condition, -medications for sleep condition  Gastric: -abdominal pain, -chronic diarrhea or IBS, -uncontrollable nausea  Kidney/Renal: -hematuria, -dialysis, kidney disease, polycystic kidney disease  Psychiatric: -homicidal thoughts, -suicidal thoughts, -prior suicide attempts, -get into fights/hurting others, -memory or concentration problems, -delusions, -hallucinations, -hospitalization for mental health problem, -depression, -anxiety, -bipolar  Drug use: - none   Reviewed their medical, surgical, family, social, medication, and allergy history and updated chart as appropriate.       Objective:   Physical Exam  BP 110/70 mmHg  Pulse 70  Temp(Src) 98 F (36.7 C) (Oral)  Resp 18  Ht 5\' 10"  (1.778 m)  Wt 135 lb 6.4 oz (61.417 kg)  BMI 19.43 kg/m2  General appearance: alert, no distress, WD/WN, lean AA male Skin: +for tinea cruris, few scattered macules, no worrisome lesions, tattoos bi lat forearms anteriorly, including syringe and veins tattoo on right forearm HEENT: normocephalic, conjunctiva/corneas normal, sclerae anicteric, PERRLA, EOMi, nares patent, no discharge or erythema, pharynx normal Oral cavity: MMM, tongue normal, teeth in good repair Neck: supple, no lymphadenopathy, no thyromegaly, no masses, normal ROM, no bruits Chest: non tender, normal shape and expansion Heart: RRR, normal S1, S2, no murmurs Lungs: CTA bilaterally, no wheezes, rhonchi, or rales Abdomen: +bs, soft, non tender, non distended, no masses, no hepatomegaly, no splenomegaly, no bruits Back: non tender, normal ROM, no scoliosis Musculoskeletal: upper extremities non tender, no obvious deformity, normal ROM throughout, lower extremities non tender, no obvious deformity, normal ROM throughout Extremities: no edema, no cyanosis, no clubbing Pulses: 2+ symmetric, upper and lower extremities, normal cap  refill Neurological: alert, oriented x 3, CN2-12 intact, strength normal upper extremities and lower extremities, sensation normal throughout, DTRs 2+ throughout, no cerebellar signs, gait normal Psychiatric: normal affect, behavior normal, pleasant  GU: normal male external genitalia, circumcised, nontender, no masses, no hernia, no lymphadenopathy  Rectal: deferred  Vision:  Uncorrected Corrected Horizontal Field of Vision  Right Eye n/a 20/13 90 degrees  Left Eye  n/a 20/15 90 degrees  Both Eyes  n/a 69/67    Applicant can recognize and distinguish among traffic control signals and devices showing standard red, green, and amber colors.  Applicant meets visual acuity requirement only when wearing corrective lenses.  Monocular Vision?: No   Hearing: Passed forced whisper   Labs: Lab Results  Component Value Date   SPECGRAV 1.025 10/21/2014   PROTEINUR n 10/21/2014   BILIRUBINUR n 10/21/2014   GLUCOSEU NEGATIVE 10/24/2013      Assessment:     Encounter Diagnosis  Name Primary?  . Encounter for occupational health examination Yes     Plan:    Medical examiners certificate completed and printed. Return as needed.  Qualifies for 2 year certificate Of note trace protein on UA but recent UA here for physical was normal.

## 2014-11-15 NOTE — Telephone Encounter (Signed)
Pt is here today with shane for a DOT Exam and pt states that he still has the jock itch and still red down there. shane recommends a nystatin cream or oral lamisil and send to pharmacy. Pt has not tried a nystatin cream yet. Pt is aware that something will be sent to his pharmacy and check with his pharmacy later

## 2014-12-13 ENCOUNTER — Telehealth: Payer: Self-pay | Admitting: Internal Medicine

## 2014-12-13 NOTE — Telephone Encounter (Signed)
Pt states that he stayed at his friends house the other day and next day he starting breaking out in bumps on his hands and moved to his arms and legs and he thinks it might be bed bugs from when he was at his friends house as he is itchying a lot and he had tried benadryl but doesn't seem to be helping. He does not have the money to come in right now to be seen so he wants to know what you recommend over the counter

## 2014-12-13 NOTE — Telephone Encounter (Signed)
Pt notified and if not better then come in to be seen

## 2014-12-13 NOTE — Telephone Encounter (Signed)
Tell him he can try an over-the-counter hydrocortisone cream but I cannot diagnose him over the phone. There is no guarantee that what is causing his symptoms are bedbugs. He should wash all of his clothing in hot water and dry them on hot temperature.

## 2015-10-17 ENCOUNTER — Encounter (HOSPITAL_COMMUNITY): Payer: Self-pay

## 2015-10-17 ENCOUNTER — Emergency Department (HOSPITAL_COMMUNITY): Payer: Managed Care, Other (non HMO)

## 2015-10-17 ENCOUNTER — Emergency Department (HOSPITAL_COMMUNITY)
Admission: EM | Admit: 2015-10-17 | Discharge: 2015-10-17 | Disposition: A | Discharge status: Home / Self Care | Payer: Managed Care, Other (non HMO) | Attending: Emergency Medicine | Admitting: Emergency Medicine

## 2015-10-17 DIAGNOSIS — R0789 Other chest pain: Secondary | ICD-10-CM | POA: Diagnosis not present

## 2015-10-17 DIAGNOSIS — Y929 Unspecified place or not applicable: Secondary | ICD-10-CM | POA: Insufficient documentation

## 2015-10-17 DIAGNOSIS — S161XXA Strain of muscle, fascia and tendon at neck level, initial encounter: Secondary | ICD-10-CM | POA: Diagnosis not present

## 2015-10-17 DIAGNOSIS — S46012A Strain of muscle(s) and tendon(s) of the rotator cuff of left shoulder, initial encounter: Secondary | ICD-10-CM | POA: Insufficient documentation

## 2015-10-17 DIAGNOSIS — Y939 Activity, unspecified: Secondary | ICD-10-CM | POA: Insufficient documentation

## 2015-10-17 DIAGNOSIS — S199XXA Unspecified injury of neck, initial encounter: Secondary | ICD-10-CM | POA: Diagnosis present

## 2015-10-17 DIAGNOSIS — Y999 Unspecified external cause status: Secondary | ICD-10-CM | POA: Insufficient documentation

## 2015-10-17 DIAGNOSIS — X58XXXA Exposure to other specified factors, initial encounter: Secondary | ICD-10-CM | POA: Insufficient documentation

## 2015-10-17 DIAGNOSIS — T148XXA Other injury of unspecified body region, initial encounter: Secondary | ICD-10-CM

## 2015-10-17 LAB — BASIC METABOLIC PANEL
ANION GAP: 4 — AB (ref 5–15)
BUN: 13 mg/dL (ref 6–20)
CALCIUM: 9.1 mg/dL (ref 8.9–10.3)
CO2: 29 mmol/L (ref 22–32)
Chloride: 105 mmol/L (ref 101–111)
Creatinine, Ser: 1.07 mg/dL (ref 0.61–1.24)
GFR calc Af Amer: >60 mL/min (ref >60)
GFR calc non Af Amer: >60 mL/min (ref >60)
GLUCOSE: 89 mg/dL (ref 65–99)
Potassium: 4 mmol/L (ref 3.5–5.1)
Sodium: 138 mmol/L (ref 135–145)

## 2015-10-17 LAB — I-STAT TROPONIN, ED
TROPONIN I, POC: 0 ng/mL (ref 0.00–0.08)
Troponin i, poc: 0 ng/mL (ref 0.00–0.08)

## 2015-10-17 LAB — CBC
HCT: 44.3 % (ref 39.0–52.0)
HEMOGLOBIN: 15.3 g/dL (ref 13.0–17.0)
MCH: 30.1 pg (ref 26.0–34.0)
MCHC: 34.5 g/dL (ref 30.0–36.0)
MCV: 87.2 fL (ref 78.0–100.0)
Platelets: 198 10*3/uL (ref 150–400)
RBC: 5.08 MIL/uL (ref 4.22–5.81)
RDW: 11.9 % (ref 11.5–15.5)
WBC: 5.4 10*3/uL (ref 4.0–10.5)

## 2015-10-17 MED ORDER — ACETAMINOPHEN 500 MG PO TABS: 1000.0000 mg | ORAL_TABLET | Freq: Three times a day (TID) | ORAL | 0 refills | Status: AC

## 2015-10-17 MED ORDER — CYCLOBENZAPRINE HCL 5 MG PO TABS: 5.0000 mg | ORAL_TABLET | Freq: Two times a day (BID) | ORAL | 0 refills | Status: AC | PRN

## 2015-10-17 NOTE — ED Provider Notes (Signed)
[facility] DEPT Provider Note   CSN: [id number] Arrival date & time: 10/17/15  1808     History   Chief Complaint Chief Complaint  Patient presents with  . Chest Pain  . Otalgia    HPI Bryan Ruiz is a 40 y.o. male.  The history is provided by the patient.  Chest Pain   This is a new problem. Episode onset: 1 months. Episode frequency: intermittent. The problem has not changed since onset.The pain is associated with movement and breathing. The pain is present in the lateral region. The pain is mild. The quality of the pain is described as brief and sharp. The pain does not radiate. Episode Length: a few seconds. Associated symptoms include back pain. Pertinent negatives include no abdominal pain, no cough, no dizziness, no fever, no headaches, no nausea, no palpitations, no shortness of breath, no vomiting and no weakness. He has tried nothing for the symptoms.  Pertinent negatives for past medical history include no CAD, no COPD, no CHF, no diabetes, no hyperlipidemia, no hypertension, no MI and no PE.  Pertinent negatives for family medical history include: no early MI.    Also with left upper back and left neck muscle aches. Worse with palpation and movement.  Past Medical History:  Diagnosis Date  . History of vertigo 2009   once prior as of 11/2014   . Onychomycosis     There are no active problems to display for this patient.   Past Surgical History:  Procedure Laterality Date  . VASECTOMY         Home Medications    Prior to Admission medications   Medication Sig Start Date End Date Taking? Authorizing Provider  acetaminophen (TYLENOL) 500 MG tablet Take 2 tablets (1,000 mg total) by mouth every 8 (eight) hours. Do not take more than 4000 mg of acetaminophen (Tylenol) in a 24-hour period. Please note that other medicines that you may be prescribed may have Tylenol as well. 10/17/15 10/22/15  Fatima Blank, MD  cyclobenzaprine (FLEXERIL) 5 MG  tablet Take 1 tablet (5 mg total) by mouth 2 (two) times daily as needed for muscle spasms. 10/17/15 10/22/15  Fatima Blank, MD  Multiple Vitamin (MULTIVITAMIN) tablet Take 1 tablet by mouth daily.    Historical Provider, MD  terbinafine (LAMISIL) 250 MG tablet Take 1 tablet (250 mg total) by mouth daily. Patient not taking: Reported on 10/17/2015 11/15/14   Girtha Rm, NP    Family History Family History  Problem Relation Age of Onset  . Diabetes Mother   . Hypertension Mother   . Diabetes Maternal Grandmother   . Kidney disease Maternal Grandmother     Social History Social History  Substance Use Topics  . Smoking status: Never Smoker  . Smokeless tobacco: Not on file  . Alcohol use No     Allergies   Review of patient's allergies indicates no known allergies.   Review of Systems Review of Systems  Constitutional: Negative for appetite change, chills, fatigue and fever.  HENT: Negative for congestion.   Eyes: Negative for visual disturbance.  Respiratory: Negative for cough, chest tightness and shortness of breath.   Cardiovascular: Positive for chest pain. Negative for palpitations.  Gastrointestinal: Negative for abdominal pain, blood in stool, diarrhea, nausea and vomiting.  Endocrine: Negative for cold intolerance and heat intolerance.  Genitourinary: Negative for decreased urine volume, difficulty urinating and frequency.  Musculoskeletal: Positive for back pain and neck pain. Negative for neck stiffness.  Skin:  Negative for rash.  Neurological: Negative for dizziness, weakness, light-headedness and headaches.     Physical Exam Updated Vital Signs BP 123/75 (BP Location: Left Arm)   Pulse 78   Temp 98.2 F (36.8 C) (Oral)   Resp 16   Ht 5\' 9"  (1.753 m)   Wt 135 lb (61.2 kg)   SpO2 98%   BMI 19.94 kg/m   Physical Exam  Constitutional: He is oriented to person, place, and time. He appears well-nourished. No distress.  HENT:  Head:  Normocephalic and atraumatic.  Right Ear: External ear normal.  Left Ear: External ear normal.  Eyes: Pupils are equal, round, and reactive to light. Right eye exhibits no discharge. Left eye exhibits no discharge. No scleral icterus.  Neck: Normal range of motion. Neck supple.    Cardiovascular: Normal rate.  Exam reveals no gallop and no friction rub.   No murmur heard. Pulmonary/Chest: Effort normal and breath sounds normal. No stridor. No respiratory distress. He has no wheezes. He has no rales. He exhibits tenderness.    Abdominal: Soft. He exhibits no distension and no mass. There is no tenderness. There is no rebound and no guarding.  Musculoskeletal: He exhibits no edema or tenderness.  Neurological: He is alert and oriented to person, place, and time.  Skin: Skin is warm and dry. No rash noted. He is not diaphoretic. No erythema.     ED Treatments / Results  Labs (all labs ordered are listed, but only abnormal results are displayed) Labs Reviewed  BASIC METABOLIC PANEL - Abnormal; Notable for the following:       Result Value   Anion gap 4 (*)    All other components within normal limits  CBC  I-STAT TROPOININ, ED  I-STAT TROPOININ, ED    EKG  EKG Interpretation  Date/Time:  Tuesday October 17 2015 18:26:43 EDT Ventricular Rate:  76 PR Interval:    QRS Duration: 102 QT Interval:  369 QTC Calculation: 415 R Axis:   74 Text Interpretation:  Sinus rhythm RAE, consider biatrial enlargement RSR' in V1 or V2, right VCD or RVH Baseline wander in lead(s) V3 no prior tracing to compare Confirmed by [facility] MD, Taevon Aschoff ([id number]) on 10/17/2015 9:19:49 PM       Radiology Dg Chest 2 View  Result Date: 10/17/2015 CLINICAL DATA:  Intermittent left chest pain for 2 weeks. EXAM: CHEST  2 VIEW COMPARISON:  None. FINDINGS: The lungs are clear wiithout focal pneumonia, edema, pneumothorax or pleural effusion. The cardiopericardial silhouette is within normal limits for size. The  visualized bony structures of the thorax are intact. Nodular density/densities projecting over the lungs are compatible with pads for telemetry leads. IMPRESSION: No active cardiopulmonary disease. Electronically Signed   By: Misty Stanley M.D.   On: 10/17/2015 19:04    Procedures Procedures (including critical care time)  Medications Ordered in ED Medications - No data to display   Initial Impression / Assessment and Plan / ED Course  I have reviewed the triage vital signs and the nursing notes.  Pertinent labs & imaging results that were available during my care of the patient were reviewed by me and considered in my medical decision making (see chart for details).  Clinical Course    Atypical chest pain most consistent with chest wall pain. HEAR score of 2. EKG without acute ischemic changes. Chest x-ray without evidence suggestive of pneumonia, pneumothorax, pneumomediastinum.  No abnormal contour of the mediastinum to suggest dissection. No evidence of acute injuries.  Troponins negative 2 . Presentation not classic for aortic dissection or esophageal perforation. Low pretest probability for pulmonary embolism.   Patient also with left shoulder and neck pain most consistent with muscle strain.   Patient is safe for discharge with strict return precautions. Patient is to follow-up with his primary care provider as needed.  Final Clinical Impressions(s) / ED Diagnoses   Final diagnoses:  Muscle strain  Atypical chest pain   Disposition: Discharge  Condition: Good  I have discussed the results, Dx and Tx plan with the patient who expressed understanding and agree(s) with the plan. Discharge instructions discussed at great length. The patient was given strict return precautions who verbalized understanding of the instructions. No further questions at time of discharge.    Discharge Medication List as of 10/17/2015 10:57 PM    START taking these medications   Details    acetaminophen (TYLENOL) 500 MG tablet Take 2 tablets (1,000 mg total) by mouth every 8 (eight) hours. Do not take more than 4000 mg of acetaminophen (Tylenol) in a 24-hour period. Please note that other medicines that you may be prescribed may have Tylenol as well., Starting Tue 10/17/2015, U ntil Sun 10/22/2015, Print    cyclobenzaprine (FLEXERIL) 5 MG tablet Take 1 tablet (5 mg total) by mouth 2 (two) times daily as needed for muscle spasms., Starting Tue 10/17/2015, Until Sun 10/22/2015, Print        Follow Up: Girtha Rm, NP [place] [phone] [phone]  Call  As needed      Fatima Blank, MD 10/18/15 [phone]

## 2015-10-17 NOTE — ED Triage Notes (Signed)
Pt presents with c/o left chest pain and left ear pain. Pt reports the pain is sharp in nature, no shortness of breath.

## 2015-10-22 ENCOUNTER — Encounter: Payer: Self-pay | Admitting: Family Medicine

## 2015-10-22 DIAGNOSIS — Z8619 Personal history of other infectious and parasitic diseases: Secondary | ICD-10-CM | POA: Insufficient documentation

## 2015-10-22 DIAGNOSIS — B351 Tinea unguium: Secondary | ICD-10-CM | POA: Insufficient documentation

## 2015-10-22 NOTE — Progress Notes (Signed)
Subjective:    Patient ID: Bryan Ruiz, male    DOB: [birth year], 40 y.o.   MRN: [id number]  HPI Chief Complaint  Patient presents with  . Other    annual physical, not on right wrist   He is here for a complete physical exam and also has multiple complaints including a new  Complains of pain to right wrist and a questionable knot to the top of his right wrist for the past 4 days. Reports pain when pushing off and when wrist is being extended. Denies injury to the wrist. Pain is dull and sharp when pushing off. Pain is nonradiating and improved at rest. No other joint pain.  Also complains of "jock itch" for several months. Itching and rash to inner thighs, worse when sweating and hot. Usually gets this in warm months. Has been getting this for approximately 9 years. States he has taken oral medication for 3 months, at least 4-5 different times for toe nail fungus but did not help with toenails or jock itch. He works in a warm environment, sweats "a lot". Does not change underwear at lunch time or when he gets home from work. Has been using topical lamisil for 2 weeks. Took a course of oral terbafine in past few months without improvement.   Last CPE: 10/21/2014 DOT exam 11/15/2014  Other providers: [facility] on [address].   Family history: HTN, DM, and questionable hyperlipidemia.   Social history: Lives alone, works as Merchant navy officer at [employer] ,  Denies smoking, drinking alcohol, and drug use Diet: nothing particular.  Exercise: not in past 4 months.   Immunizations: up to date.   Health maintenance:  Colonoscopy: never Last PSA: never Sexually active and would like std testing.  Last Dental Exam: twice annually. Needs a wisdom tooth pulled.  Last Eye Exam: earlier this year, prescription glasses.   Wears seatbelt always, uses sunscreen, smoke detectors in home and functioning, does not text while driving, feels safe in home environment. Guns in home and are locked up.     Reviewed allergies, medications, past medical, surgical, family, and social history.     Past Medical History:  Diagnosis Date  . History of tinea cruris   . History of vertigo 2009   once prior as of 11/2014   . Onychomycosis    Past Surgical History:  Procedure Laterality Date  . VASECTOMY     No current outpatient prescriptions on file prior to visit.   No current facility-administered medications on file prior to visit.     Reviewed allergies, medications, past medical, surgical, family, and social history.    Review of Systems Review of Systems Constitutional: -fever, -chills, -sweats, -unexpected weight change,-fatigue ENT: -runny nose, -ear pain, -sore throat Cardiology:  -chest pain, -palpitations, -edema Respiratory: -cough, -shortness of breath, -wheezing Gastroenterology: -abdominal pain, -nausea, -vomiting, -diarrhea, -constipation  Hematology: -bleeding or bruising problems Musculoskeletal: + right wrist pain but no other arthralgias, -myalgias, -joint swelling, -back pain Ophthalmology: -vision changes Urology: -dysuria, -difficulty urinating, -hematuria, -urinary frequency, -urgency Neurology: -headache, -weakness, -tingling, -numbness       Objective:   Physical Exam BP 110/68   Pulse 79   Ht 5\' 9"  (1.753 m)   Wt 137 lb 12.8 oz (62.5 kg)   BMI 20.35 kg/m   General Appearance:    Alert, cooperative, no distress, appears stated age  Head:    Normocephalic, without obvious abnormality, atraumatic  Eyes:    PERRL, conjunctiva/corneas clear, EOM's intact, fundi  benign  Ears:    Normal TM's and external ear canals  Nose:   Nares normal, mucosa normal, no drainage or sinus   tenderness  Throat:   Lips, mucosa, and tongue normal; teeth and gums normal  Neck:   Supple, no lymphadenopathy;  thyroid:  no   enlargement/tenderness/nodules; no carotid   bruit or JVD  Back:    Spine nontender, no curvature, ROM normal, no CVA     tenderness  Lungs:      Clear to auscultation bilaterally without wheezes, rales or     ronchi; respirations unlabored  Chest Wall:    No tenderness or deformity   Heart:    Regular rate and rhythm, S1 and S2 normal, no murmur, rub   or gallop  Breast Exam:    No chest wall tenderness, masses or gynecomastia  Abdomen:     Soft, non-tender, nondistended, normoactive bowel sounds,    no masses, no hepatosplenomegaly  Genitalia:    Refused complete exam.  Inner thighs with darkened skin, mild erythema, skin intact, no edema or drainage.   Rectal:   Deferred due to age <40 and lack of symptoms  Extremities:   No clubbing, cyanosis or edema. Right anterior wrist with mild tenderness, pain with hyperextension, no erythema, edema, normal capillary refill, pulse and ROM. Normal   Pulses:   2+ and symmetric all extremities  Skin:   Skin color, texture, turgor normal, no rashes or lesions. Toenails with discoloration, thickening and deformity.  Lymph nodes:   Cervical, supraclavicular, and axillary nodes normal  Neurologic:   CNII-XII intact, normal strength, sensation and gait; reflexes 2+ and symmetric throughout          Psych:   Normal mood, affect, hygiene and grooming.     urinalysis dipstick:  Spec grav 1.030. Trace uro otherwise normal.      Assessment & Plan:  Routine general medical examination at a health care facility - Plan: Comprehensive metabolic panel, Lipid panel, CBC with Differential/Platelet, POCT urinalysis dipstick  Tinea cruris - Plan: CBC with Differential/Platelet, Ambulatory referral to Dermatology  Onychomycosis - Plan: CBC with Differential/Platelet, Ambulatory referral to Podiatry  Acute wrist pain, right  Screening for STD (sexually transmitted disease) - Plan: RPR, HIV antibody, GC/Chlamydia Probe Amp  Screening for lipid disorders - Plan: Lipid panel  Referral to podiatrist for severe onychomycosis. He has not seen one since moving to this area.  Recurrent tinea cruris in spite of  several weeks of cream and history of oral antifungals for onychomycosis and tinea cruris. Plan to refer to dermatologist and patient is ok with this.  Right wrist with pain, questionable cyst, will treat with 2 weeks of anti-inflammatories to see if this improves symptoms. Will consider referral to hand specialist if symptoms worsen.  Discussed that overall he appears to be quite healthy. Recommend that he pay closer attention to healthy lifestyle and start exercising at least 150 minutes per week. Discussed safety and health promotion. He appears to be up-to-date with immunizations. STD screening per patient request. He is fasting and will screen for lipid disorders.  Follow up pending labs or in 1 year.

## 2015-10-23 ENCOUNTER — Encounter: Payer: Self-pay | Admitting: Family Medicine

## 2015-10-23 ENCOUNTER — Ambulatory Visit (INDEPENDENT_AMBULATORY_CARE_PROVIDER_SITE_OTHER): Payer: Managed Care, Other (non HMO) | Admitting: Family Medicine

## 2015-10-23 VITALS — BP 110/68 | HR 79 | Ht 69.0 in | Wt 137.8 lb

## 2015-10-23 DIAGNOSIS — Z0000 Encounter for general adult medical examination without abnormal findings: Secondary | ICD-10-CM

## 2015-10-23 DIAGNOSIS — B351 Tinea unguium: Secondary | ICD-10-CM | POA: Diagnosis not present

## 2015-10-23 DIAGNOSIS — Z113 Encounter for screening for infections with a predominantly sexual mode of transmission: Secondary | ICD-10-CM | POA: Diagnosis not present

## 2015-10-23 DIAGNOSIS — Z13220 Encounter for screening for lipoid disorders: Secondary | ICD-10-CM

## 2015-10-23 DIAGNOSIS — M25531 Pain in right wrist: Secondary | ICD-10-CM | POA: Diagnosis not present

## 2015-10-23 DIAGNOSIS — B356 Tinea cruris: Secondary | ICD-10-CM

## 2015-10-23 LAB — CBC WITH DIFFERENTIAL/PLATELET
BASOS ABS: 36 {cells}/uL (ref 0–200)
Basophils Relative: 1 %
EOS PCT: 9 %
Eosinophils Absolute: 324 cells/uL (ref 15–500)
HCT: 45.2 % (ref 38.5–50.0)
Hemoglobin: 15.5 g/dL (ref 13.2–17.1)
Lymphocytes Relative: 43 %
Lymphs Abs: 1548 cells/uL (ref 850–3900)
MCH: 30.3 pg (ref 27.0–33.0)
MCHC: 34.3 g/dL (ref 32.0–36.0)
MCV: 88.5 fL (ref 80.0–100.0)
MPV: 10.8 fL (ref 7.5–12.5)
Monocytes Absolute: 432 cells/uL (ref 200–950)
Monocytes Relative: 12 %
NEUTROS ABS: 1260 {cells}/uL — AB (ref 1500–7800)
NEUTROS PCT: 35 %
PLATELETS: 178 10*3/uL (ref 140–400)
RBC: 5.11 MIL/uL (ref 4.20–5.80)
RDW: 12.8 % (ref 11.0–15.0)
WBC: 3.6 10*3/uL — AB (ref 4.0–10.5)

## 2015-10-23 LAB — POCT URINALYSIS DIPSTICK
Bilirubin, UA: NEGATIVE
Glucose, UA: NEGATIVE
KETONES UA: NEGATIVE
Leukocytes, UA: NEGATIVE
Nitrite, UA: NEGATIVE
PH UA: 6
PROTEIN UA: NEGATIVE
RBC UA: NEGATIVE
UROBILINOGEN UA: 1

## 2015-10-23 LAB — COMPREHENSIVE METABOLIC PANEL
ALK PHOS: 25 U/L — AB (ref 40–115)
ALT: 14 U/L (ref 9–46)
AST: 16 U/L (ref 10–40)
Albumin: 4.3 g/dL (ref 3.6–5.1)
BUN: 16 mg/dL (ref 7–25)
CO2: 26 mmol/L (ref 20–31)
CREATININE: 1.24 mg/dL (ref 0.60–1.35)
Calcium: 9.1 mg/dL (ref 8.6–10.3)
Chloride: 104 mmol/L (ref 98–110)
GLUCOSE: 87 mg/dL (ref 65–99)
POTASSIUM: 4.4 mmol/L (ref 3.5–5.3)
SODIUM: 136 mmol/L (ref 135–146)
TOTAL PROTEIN: 7.1 g/dL (ref 6.1–8.1)
Total Bilirubin: 1.7 mg/dL — ABNORMAL HIGH (ref 0.2–1.2)

## 2015-10-23 LAB — LIPID PANEL
Cholesterol: 124 mg/dL — ABNORMAL LOW (ref 125–200)
HDL: 45 mg/dL (ref >=40)
LDL CALC: 69 mg/dL (ref <130)
TRIGLYCERIDES: 51 mg/dL (ref <150)
Total CHOL/HDL Ratio: 2.8 Ratio (ref <=5.0)
VLDL: 10 mg/dL (ref <30)

## 2015-10-23 LAB — HIV ANTIBODY (ROUTINE TESTING W REFLEX): HIV 1&2 Ab, 4th Generation: NONREACTIVE

## 2015-10-23 NOTE — Patient Instructions (Addendum)
Take 1 Aleve twice daily with food and see if this helps with your wrist. If this is not helping you can take 2 Aleve twice daily for about a week and then if that does not help I would like for you to come back and see me.  We are referring you to the podiatrist for toenail fungus and they will call you to schedule an appointment. You have an appointment with the dermatologist for jock itch on September 13 at 10:50 AM.  Continue using cream to the inner thighs and make sure you are keeping the area dry by changing underwear as often as possible when sweating at work and as soon as you get home.   Try to start exercising and get at least 150 minutes per week. We will call you with lab results.  Preventative Care for Adults, Male       REGULAR HEALTH EXAMS:  A routine yearly physical is a good way to check in with your primary care provider about your health and preventive screening. It is also an opportunity to share updates about your health and any concerns you have, and receive a thorough all-over exam.   Most health insurance companies pay for at least some preventative services.  Check with your health plan for specific coverages.  WHAT PREVENTATIVE SERVICES DO MEN NEED?  Adult men should have their weight and blood pressure checked regularly.   Men age 40 and older should have their cholesterol levels checked regularly.  Beginning at age 32 and continuing to age 5, men should be screened for colorectal cancer.  Certain people should may need continued testing until age 35.  Other cancer screening may include exams for testicular and prostate cancer.  Updating vaccinations is part of preventative care.  Vaccinations help protect against diseases such as the flu.  Lab tests are generally done as part of preventative care to screen for anemia and blood disorders, to screen for problems with the kidneys and liver, to screen for bladder problems, to check blood sugar, and to check your  cholesterol level.  Preventative services generally include counseling about diet, exercise, avoiding tobacco, drugs, excessive alcohol consumption, and sexually transmitted infections.    GENERAL RECOMMENDATIONS FOR GOOD HEALTH:  Healthy diet:  Eat a variety of foods, including fruit, vegetables, animal or vegetable protein, such as meat, fish, chicken, and eggs, or beans, lentils, tofu, and grains, such as rice.  Drink plenty of water daily.  Decrease saturated fat in the diet, avoid lots of red meat, processed foods, sweets, fast foods, and fried foods.  Exercise:  Aerobic exercise helps maintain good heart health. At least 30-40 minutes of moderate-intensity exercise is recommended. For example, a brisk walk that increases your heart rate and breathing. This should be done on most days of the week.   Find a type of exercise or a variety of exercises that you enjoy so that it becomes a part of your daily life.  Examples are running, walking, swimming, water aerobics, and biking.  For motivation and support, explore group exercise such as aerobic class, spin class, Zumba, Yoga,or  martial arts, etc.    Set exercise goals for yourself, such as a certain weight goal, walk or run in a race such as a 5k walk/run.  Speak to your primary care provider about exercise goals.  Disease prevention:  If you smoke or chew tobacco, find out from your caregiver how to quit. It can literally save your life, no matter how  long you have been a tobacco user. If you do not use tobacco, never begin.   Maintain a healthy diet and normal weight. Increased weight leads to problems with blood pressure and diabetes.   The Body Mass Index or BMI is a way of measuring how much of your body is fat. Having a BMI above 27 increases the risk of heart disease, diabetes, hypertension, stroke and other problems related to obesity. Your caregiver can help determine your BMI and based on it develop an exercise and dietary  program to help you achieve or maintain this important measurement at a healthful level.  High blood pressure causes heart and blood vessel problems.  Persistent high blood pressure should be treated with medicine if weight loss and exercise do not work.   Fat and cholesterol leaves deposits in your arteries that can block them. This causes heart disease and vessel disease elsewhere in your body.  If your cholesterol is found to be high, or if you have heart disease or certain other medical conditions, then you may need to have your cholesterol monitored frequently and be treated with medication.   Ask if you should have a stress test if your history suggests this. A stress test is a test done on a treadmill that looks for heart disease. This test can find disease prior to there being a problem.  Avoid drinking alcohol in excess (more than two drinks per day).  Avoid use of street drugs. Do not share needles with anyone. Ask for professional help if you need assistance or instructions on stopping the use of alcohol, cigarettes, and/or drugs.  Brush your teeth twice a day with fluoride toothpaste, and floss once a day. Good oral hygiene prevents tooth decay and gum disease. The problems can be painful, unattractive, and can cause other health problems. Visit your dentist for a routine oral and dental check up and preventive care every 6-12 months.   Look at your skin regularly.  Use a mirror to look at your back. Notify your caregivers of changes in moles, especially if there are changes in shapes, colors, a size larger than a pencil eraser, an irregular border, or development of new moles.  Safety:  Use seatbelts 100% of the time, whether driving or as a passenger.  Use safety devices such as hearing protection if you work in environments with loud noise or significant background noise.  Use safety glasses when doing any work that could send debris in to the eyes.  Use a helmet if you ride a bike or  motorcycle.  Use appropriate safety gear for contact sports.  Talk to your caregiver about gun safety.  Use sunscreen with a SPF (or skin protection factor) of 15 or greater.  Lighter skinned people are at a greater risk of skin cancer. Don't forget to also wear sunglasses in order to protect your eyes from too much damaging sunlight. Damaging sunlight can accelerate cataract formation.   Practice safe sex. Use condoms. Condoms are used for birth control and to help reduce the spread of sexually transmitted infections (or STIs).  Some of the STIs are gonorrhea (the clap), chlamydia, syphilis, trichomonas, herpes, HPV (human papilloma virus) and HIV (human immunodeficiency virus) which causes AIDS. The herpes, HIV and HPV are viral illnesses that have no cure. These can result in disability, cancer and death.   Keep carbon monoxide and smoke detectors in your home functioning at all times. Change the batteries every 6 months or use a model that  plugs into the wall.   Vaccinations:  Stay up to date with your tetanus shots and other required immunizations. You should have a booster for tetanus every 10 years. Be sure to get your flu shot every year, since 5%-20% of the [place] population comes down with the flu. The flu vaccine changes each year, so being vaccinated once is not enough. Get your shot in the fall, before the flu season peaks.   Other vaccines to consider:  Pneumococcal vaccine to protect against certain types of pneumonia.  This is normally recommended for adults age 29 or older.  However, adults younger than 38 years old with certain underlying conditions such as diabetes, heart or lung disease should also receive the vaccine.  Shingles vaccine to protect against Varicella Zoster if you are older than age 71, or younger than 38 years old with certain underlying illness.  Hepatitis A vaccine to protect against a form of infection of the liver by a virus acquired from food.  Hepatitis B  vaccine to protect against a form of infection of the liver by a virus acquired from blood or body fluids, particularly if you work in health care.  If you plan to travel internationally, check with your local health department for specific vaccination recommendations.  Cancer Screening:  Most routine colon cancer screening begins at the age of 95. On a yearly basis, doctors may provide special easy to use take-home tests to check for hidden blood in the stool. Sigmoidoscopy or colonoscopy can detect the earliest forms of colon cancer and is life saving. These tests use a small camera at the end of a tube to directly examine the colon. Speak to your caregiver about this at age 20, when routine screening begins (and is repeated every 5 years unless early forms of pre-cancerous polyps or small growths are found).   At the age of 74 men usually start screening for prostate cancer every year. Screening may begin at a younger age for those with higher risk. Those at higher risk include African-Americans or having a family history of prostate cancer. There are two types of tests for prostate cancer:   Prostate-specific antigen (PSA) testing. Recent studies raise questions about prostate cancer using PSA and you should discuss this with your caregiver.   Digital rectal exam (in which your doctor's lubricated and gloved finger feels for enlargement of the prostate through the anus).   Screening for testicular cancer.  Do a monthly exam of your testicles. Gently roll each testicle between your thumb and fingers, feeling for any abnormal lumps. The best time to do this is after a hot shower or bath when the tissues are looser. Notify your caregivers of any lumps, tenderness or changes in size or shape immediately.

## 2015-10-24 LAB — GC/CHLAMYDIA PROBE AMP
CT PROBE, AMP APTIMA: NOT DETECTED
GC Probe RNA: NOT DETECTED

## 2015-10-24 LAB — RPR

## 2015-11-22 ENCOUNTER — Ambulatory Visit: Payer: Managed Care, Other (non HMO) | Admitting: Podiatry

## 2016-08-02 ENCOUNTER — Ambulatory Visit (INDEPENDENT_AMBULATORY_CARE_PROVIDER_SITE_OTHER): Payer: Managed Care, Other (non HMO) | Admitting: Family Medicine

## 2016-08-02 ENCOUNTER — Encounter: Payer: Self-pay | Admitting: Family Medicine

## 2016-08-02 VITALS — BP 120/76 | HR 71 | Temp 98.3°F | Resp 16 | Wt 138.0 lb

## 2016-08-02 DIAGNOSIS — R05 Cough: Secondary | ICD-10-CM | POA: Diagnosis not present

## 2016-08-02 DIAGNOSIS — B351 Tinea unguium: Secondary | ICD-10-CM

## 2016-08-02 DIAGNOSIS — Z9109 Other allergy status, other than to drugs and biological substances: Secondary | ICD-10-CM | POA: Diagnosis not present

## 2016-08-02 DIAGNOSIS — R058 Other specified cough: Secondary | ICD-10-CM

## 2016-08-02 MED ORDER — LORATADINE 10 MG PO TABS: 10.0000 mg | ORAL_TABLET | Freq: Every day | ORAL | 2 refills | Status: DC

## 2016-08-02 NOTE — Patient Instructions (Addendum)
Start taking the Claritin daily. You may also try a nasal spray such as Flonase or Rhinocort if the pill is not helping. Let me know if your cough is not improving in the next 2 weeks or if you get worse let me know.   [facility] will call you.

## 2016-08-02 NOTE — Progress Notes (Signed)
Subjective: Chief Complaint  Patient presents with  . dry cough    dry cough lignering for the last month. ear itching     Bryan Ruiz is a 41 y.o. male who presents for a month history of dry cough and right ear itching. Reports occasional mild rhinorrhea. States his chest felt tight last week with coughing. No wheezing or shortness of breath.   Denies fever, headache, sinus pressure, sore throat, chest pain, abdominal pain, back pain, N/V/D. No LE edema.   Does not smoke. No recent antibiotics.  History of allergies last year. Is not taking anything. He does work in a Teaching laboratory technician.  Denies history of asthma, bronchitis or pneumonia.  No new medications.  Denies history of reflux.   Treatment to date: none.  Denies sick contacts.  No other aggravating or relieving factors.    He is requesting a new referral to podiatry for toenail fungus. States his old referral expired and he was not able to go to the appointment.   ROS as in subjective.   Objective: Vitals:   08/02/16 1118  BP: 120/76  Pulse: 71  Resp: 16  Temp: 98.3 F (36.8 C)    General appearance: Alert, WD/WN, no distress, mildly ill appearing                             Skin: warm, no rash                           Head: no sinus tenderness                            Eyes: conjunctiva normal, corneas clear, PERRLA                            Ears: pearly TMs, external ear canals normal                          Nose: septum midline, turbinates swollen, with erythema and clear discharge             Mouth/throat: MMM, tongue normal, mild pharyngeal erythema                           Neck: supple, no adenopathy, no thyromegaly, nontender                          Heart: RRR, normal S1, S2, no murmurs                         Lungs: CTA bilaterally, no wheezes, rales, or rhonchi      Assessment: Cough present for greater than 3 weeks - Plan: Spirometry with graph  Onychomycosis - Plan: Ambulatory referral to  Podiatry  Environmental allergies - Plan: loratadine (CLARITIN) 10 MG tablet    Plan: Discussed diagnosis and treatment of allergies and dry cough.  PFT shows normal FEV1/FVC.  Suggested symptomatic OTC remedies. Sent a prescription for Claritin per request. May also try Flonase or nasal spray. He will let me know if symptoms are not improving. Will consider GERD at that point or possibly an antibiotic but he does not appear infectious today.  Nasal saline spray for  congestion.  Call/return in 2 weeks if symptoms aren't resolving or if he gets worse.

## 2016-09-02 ENCOUNTER — Other Ambulatory Visit: Payer: Self-pay | Admitting: Podiatry

## 2016-09-02 ENCOUNTER — Ambulatory Visit (INDEPENDENT_AMBULATORY_CARE_PROVIDER_SITE_OTHER): Payer: Managed Care, Other (non HMO) | Admitting: Podiatry

## 2016-09-02 ENCOUNTER — Encounter: Payer: Self-pay | Admitting: Podiatry

## 2016-09-02 DIAGNOSIS — B351 Tinea unguium: Secondary | ICD-10-CM

## 2016-09-02 MED ORDER — TERBINAFINE HCL 250 MG PO TABS: 250.0000 mg | ORAL_TABLET | Freq: Every day | ORAL | 0 refills | Status: DC

## 2016-09-02 NOTE — Progress Notes (Signed)
   Subjective:    Patient ID: Bryan Ruiz, male    DOB: March 26, 1975, 41 y.o.   MRN: [id number]  HPI    Review of Systems  Skin: Positive for color change.       Objective:   Physical Exam        Assessment & Plan:

## 2016-09-03 LAB — HEPATIC FUNCTION PANEL
ALK PHOS: 31 IU/L — AB (ref 39–117)
ALT: 16 IU/L (ref 0–44)
AST: 18 IU/L (ref 0–40)
Albumin: 4.4 g/dL (ref 3.5–5.5)
Bilirubin Total: 1.1 mg/dL (ref 0.0–1.2)
Bilirubin, Direct: 0.23 mg/dL (ref 0.00–0.40)
TOTAL PROTEIN: 7.1 g/dL (ref 6.0–8.5)

## 2016-09-04 NOTE — Progress Notes (Signed)
   Subjective: Patient presents today for possible treatment and evaluation of fungal nails bilaterally 1 through 5. Patient states that the nails have been discolored and thickened for the last 10 years. He reports soreness to the nails when he wears shoes. He reports taking antifungal medication in 2007. Patient presents today for further treatment and evaluation.  Objective: Physical Exam General: The patient is alert and oriented x3 in no acute distress.  Dermatology: Hyperkeratotic, discolored, thickened, onychodystrophy of nails noted bilaterally.  Skin is warm, dry and supple bilateral lower extremities. Negative for open lesions or macerations.  Vascular: Palpable pedal pulses bilaterally. No edema or erythema noted. Capillary refill within normal limits.  Neurological: Epicritic and protective threshold grossly intact bilaterally.   Musculoskeletal Exam: Range of motion within normal limits to all pedal and ankle joints bilateral. Muscle strength 5/5 in all groups bilateral.   Assessment: #1 onychodystrophy bilateral toenails #2 possible onychomycosis #3 hyperkeratotic nails bilateral  Plan of Care:  #1 Patient was evaluated. #2 Orders for liver function tests were ordered today.  #3 Prescription for Lamisil 250 mg #90 given to patient.  #4 Appt with Janett Billow for fungal laser treatment.  #5 Return to clinic in 6 months.   Edrick Kins, DPM [facility]  Dr. Edrick Kins, [address]                                        [place], [place] [address]                Office [phone]  Fax [phone]

## 2016-10-14 ENCOUNTER — Ambulatory Visit: Payer: Managed Care, Other (non HMO)

## 2016-10-21 ENCOUNTER — Ambulatory Visit: Payer: Managed Care, Other (non HMO)

## 2017-08-25 ENCOUNTER — Ambulatory Visit (INDEPENDENT_AMBULATORY_CARE_PROVIDER_SITE_OTHER): Payer: Managed Care, Other (non HMO) | Admitting: Family Medicine

## 2017-08-25 ENCOUNTER — Encounter: Payer: Self-pay | Admitting: Family Medicine

## 2017-08-25 VITALS — BP 112/70 | HR 87 | Temp 98.5°F | Ht 69.0 in | Wt 139.2 lb

## 2017-08-25 DIAGNOSIS — R519 Headache, unspecified: Secondary | ICD-10-CM

## 2017-08-25 DIAGNOSIS — R51 Headache: Secondary | ICD-10-CM

## 2017-08-25 DIAGNOSIS — Z0000 Encounter for general adult medical examination without abnormal findings: Secondary | ICD-10-CM | POA: Diagnosis not present

## 2017-08-25 LAB — POCT URINALYSIS DIP (PROADVANTAGE DEVICE)
BILIRUBIN UA: NEGATIVE
Blood, UA: NEGATIVE
GLUCOSE UA: NEGATIVE mg/dL
Ketones, POC UA: NEGATIVE mg/dL
LEUKOCYTES UA: NEGATIVE
NITRITE UA: NEGATIVE
Protein Ur, POC: NEGATIVE mg/dL
pH, UA: 6.5 (ref 5.0–8.0)

## 2017-08-25 NOTE — Progress Notes (Signed)
Subjective:    Patient ID: Bryan Ruiz, male    DOB: [birth year], 42 y.o.   MRN: [id number]  HPI Chief Complaint  Patient presents with  . Annual Exam   He is here for a complete physical exam. Last CPE: 2017   Reports having headaches frontal headaches almost daily for years. States he does not have to take medication for this, they resolve spontaneously. States he does not stay well hydrated.   Other providers: Dr Amalia Hailey - podiatrist    Social history: Lives alone, 5 kids, works as Merchant navy officer at [employer]  Denies smoking, drinking alcohol, drug use Diet: fairly healthy  Exercise: walks a lot   Immunizations: up to date  Health maintenance:  Colonoscopy: never  Last PSA: never  Last Dental Exam: 2 weeks ago. Twice annually  Last Eye Exam: 2- 3 months ago   Wears seatbelt always, uses sunscreen, smoke detectors in home and functioning, does not text while driving, feels safe in home environment.  Reviewed allergies, medications, past medical, surgical, family, and social history.     Review of Systems Review of Systems Constitutional: -fever, -chills, -sweats, -unexpected weight change,-fatigue ENT: -runny nose, -ear pain, -sore throat Cardiology:  -chest pain, -palpitations, -edema Respiratory: -cough, -shortness of breath, -wheezing Gastroenterology: -abdominal pain, -nausea, -vomiting, -diarrhea, -constipation  Hematology: -bleeding or bruising problems Musculoskeletal: -arthralgias, -myalgias, -joint swelling, -back pain Ophthalmology: -vision changes Urology: -dysuria, -difficulty urinating, -hematuria, -urinary frequency, -urgency Neurology: + intermittent frontal chronic headache, -weakness, -tingling, -numbness       Objective:   Physical Exam BP 112/70   Pulse 87   Temp 98.5 F (36.9 C) (Oral)   Ht 5\' 9"  (1.753 m)   Wt 139 lb 3.2 oz (63.1 kg)   SpO2 96%   BMI 20.56 kg/m   General Appearance:    Alert, cooperative, no distress, appears  stated age  Head:    Normocephalic, without obvious abnormality, atraumatic  Eyes:    PERRL, conjunctiva/corneas clear, EOM's intact, fundi    benign  Ears:    Normal TM's and external ear canals  Nose:   Nares normal, mucosa normal, no drainage or sinus   tenderness  Throat:   Lips, mucosa, and tongue normal; teeth and gums normal  Neck:   Supple, no lymphadenopathy;  thyroid:  no   enlargement/tenderness/nodules; no carotid   bruit or JVD  Back:    Spine nontender, no curvature, ROM normal, no CVA     tenderness  Lungs:     Clear to auscultation bilaterally without wheezes, rales or     ronchi; respirations unlabored  Chest Wall:    No tenderness or deformity   Heart:    Regular rate and rhythm, S1 and S2 normal, no murmur, rub   or gallop  Breast Exam:    No chest wall tenderness, masses or gynecomastia  Abdomen:     Soft, non-tender, nondistended, normoactive bowel sounds,    no masses, no hepatosplenomegaly  Genitalia:    Normal male external genitalia without lesions.  Testicles without masses.  No inguinal hernias.  Rectal:   Deferred due to age <40 and lack of symptoms  Extremities:   No clubbing, cyanosis or edema  Pulses:   2+ and symmetric all extremities  Skin:   Skin color, texture, turgor normal, no rashes or lesions  Lymph nodes:   Cervical, supraclavicular, and axillary nodes normal  Neurologic:   CNII-XII intact, normal strength, sensation and gait; reflexes 2+ and symmetric throughout  Psych:   Normal mood, affect, hygiene and grooming.     Urinalysis dipstick: negative        Assessment & Plan:  Routine general medical examination at a health care facility - Plan: POCT Urinalysis DIP (Proadvantage Device), CBC with Differential/Platelet, Comprehensive metabolic panel, TSH, Lipid panel  Chronic nonintractable headache, unspecified headache type  He appears to be doing well.  Recommend that he increase water intake and hydration status.  Discussed that  his urine should be a light yellow.  He will look for any triggers or patterns and symptoms of headache and let me know if this becomes more bothersome. He appears to be up-to-date with his immunizations.  Tdap was in the past 5 years but he cannot recall where he received this. Discussed safety and health promotion. Father diagnosed in the past few weeks with prostate cancer at age 76 .  Discussed that he will need to start prostate cancer screening at age 61.  He is asymptomatic currently. Fasting labs done.  Follow-up pending lab results.

## 2017-08-25 NOTE — Patient Instructions (Addendum)
Increase your fluids and see if this helps with your headaches.  Keep an eye on your headache triggers, symptoms and let me know if this is getting worse.   We will call you with your lab results.    Preventative Care for Adults, Male       REGULAR HEALTH EXAMS:  A routine yearly physical is a good way to check in with your primary care provider about your health and preventive screening. It is also an opportunity to share updates about your health and any concerns you have, and receive a thorough all-over exam.   Most health insurance companies pay for at least some preventative services.  Check with your health plan for specific coverages.  WHAT PREVENTATIVE SERVICES DO MEN NEED?  Adult men should have their weight and blood pressure checked regularly.   Men age 71 and older should have their cholesterol levels checked regularly.  Beginning at age 19 and continuing to age 26, men should be screened for colorectal cancer.  Certain people should may need continued testing until age 21.  Other cancer screening may include exams for testicular and prostate cancer.  Updating vaccinations is part of preventative care.  Vaccinations help protect against diseases such as the flu.  Lab tests are generally done as part of preventative care to screen for anemia and blood disorders, to screen for problems with the kidneys and liver, to screen for bladder problems, to check blood sugar, and to check your cholesterol level.  Preventative services generally include counseling about diet, exercise, avoiding tobacco, drugs, excessive alcohol consumption, and sexually transmitted infections.    GENERAL RECOMMENDATIONS FOR GOOD HEALTH:  Healthy diet:  Eat a variety of foods, including fruit, vegetables, animal or vegetable protein, such as meat, fish, chicken, and eggs, or beans, lentils, tofu, and grains, such as rice.  Drink plenty of water daily.  Decrease saturated fat in the diet, avoid  lots of red meat, processed foods, sweets, fast foods, and fried foods.  Exercise:  Aerobic exercise helps maintain good heart health. At least 30-40 minutes of moderate-intensity exercise is recommended. For example, a brisk walk that increases your heart rate and breathing. This should be done on most days of the week.   Find a type of exercise or a variety of exercises that you enjoy so that it becomes a part of your daily life.  Examples are running, walking, swimming, water aerobics, and biking.  For motivation and support, explore group exercise such as aerobic class, spin class, Zumba, Yoga,or  martial arts, etc.    Set exercise goals for yourself, such as a certain weight goal, walk or run in a race such as a 5k walk/run.  Speak to your primary care provider about exercise goals.  Disease prevention:  If you smoke or chew tobacco, find out from your caregiver how to quit. It can literally save your life, no matter how long you have been a tobacco user. If you do not use tobacco, never begin.   Maintain a healthy diet and normal weight. Increased weight leads to problems with blood pressure and diabetes.   The Body Mass Index or BMI is a way of measuring how much of your body is fat. Having a BMI above 27 increases the risk of heart disease, diabetes, hypertension, stroke and other problems related to obesity. Your caregiver can help determine your BMI and based on it develop an exercise and dietary program to help you achieve or maintain this important measurement  at a healthful level.  High blood pressure causes heart and blood vessel problems.  Persistent high blood pressure should be treated with medicine if weight loss and exercise do not work.   Fat and cholesterol leaves deposits in your arteries that can block them. This causes heart disease and vessel disease elsewhere in your body.  If your cholesterol is found to be high, or if you have heart disease or certain other medical  conditions, then you may need to have your cholesterol monitored frequently and be treated with medication.   Ask if you should have a stress test if your history suggests this. A stress test is a test done on a treadmill that looks for heart disease. This test can find disease prior to there being a problem.  Avoid drinking alcohol in excess (more than two drinks per day).  Avoid use of street drugs. Do not share needles with anyone. Ask for professional help if you need assistance or instructions on stopping the use of alcohol, cigarettes, and/or drugs.  Brush your teeth twice a day with fluoride toothpaste, and floss once a day. Good oral hygiene prevents tooth decay and gum disease. The problems can be painful, unattractive, and can cause other health problems. Visit your dentist for a routine oral and dental check up and preventive care every 6-12 months.   Look at your skin regularly.  Use a mirror to look at your back. Notify your caregivers of changes in moles, especially if there are changes in shapes, colors, a size larger than a pencil eraser, an irregular border, or development of new moles.  Safety:  Use seatbelts 100% of the time, whether driving or as a passenger.  Use safety devices such as hearing protection if you work in environments with loud noise or significant background noise.  Use safety glasses when doing any work that could send debris in to the eyes.  Use a helmet if you ride a bike or motorcycle.  Use appropriate safety gear for contact sports.  Talk to your caregiver about gun safety.  Use sunscreen with a SPF (or skin protection factor) of 15 or greater.  Lighter skinned people are at a greater risk of skin cancer. Don't forget to also wear sunglasses in order to protect your eyes from too much damaging sunlight. Damaging sunlight can accelerate cataract formation.   Practice safe sex. Use condoms. Condoms are used for birth control and to help reduce the spread of  sexually transmitted infections (or STIs).  Some of the STIs are gonorrhea (the clap), chlamydia, syphilis, trichomonas, herpes, HPV (human papilloma virus) and HIV (human immunodeficiency virus) which causes AIDS. The herpes, HIV and HPV are viral illnesses that have no cure. These can result in disability, cancer and death.   Keep carbon monoxide and smoke detectors in your home functioning at all times. Change the batteries every 6 months or use a model that plugs into the wall.   Vaccinations:  Stay up to date with your tetanus shots and other required immunizations. You should have a booster for tetanus every 10 years. Be sure to get your flu shot every year, since 5%-20% of the [place] population comes down with the flu. The flu vaccine changes each year, so being vaccinated once is not enough. Get your shot in the fall, before the flu season peaks.   Other vaccines to consider:  Pneumococcal vaccine to protect against certain types of pneumonia.  This is normally recommended for adults age 62 or  older.  However, adults younger than 40 years old with certain underlying conditions such as diabetes, heart or lung disease should also receive the vaccine.  Shingles vaccine to protect against Varicella Zoster if you are older than age 43, or younger than 40 years old with certain underlying illness.  Hepatitis A vaccine to protect against a form of infection of the liver by a virus acquired from food.  Hepatitis B vaccine to protect against a form of infection of the liver by a virus acquired from blood or body fluids, particularly if you work in health care.  If you plan to travel internationally, check with your local health department for specific vaccination recommendations.  Cancer Screening:  Most routine colon cancer screening begins at the age of 36. On a yearly basis, doctors may provide special easy to use take-home tests to check for hidden blood in the stool. Sigmoidoscopy or  colonoscopy can detect the earliest forms of colon cancer and is life saving. These tests use a small camera at the end of a tube to directly examine the colon. Speak to your caregiver about this at age 52, when routine screening begins (and is repeated every 5 years unless early forms of pre-cancerous polyps or small growths are found).   At the age of 101 men usually start screening for prostate cancer every year. Screening may begin at a younger age for those with higher risk. Those at higher risk include African-Americans or having a family history of prostate cancer. There are two types of tests for prostate cancer:   Prostate-specific antigen (PSA) testing. Recent studies raise questions about prostate cancer using PSA and you should discuss this with your caregiver.   Digital rectal exam (in which your doctor's lubricated and gloved finger feels for enlargement of the prostate through the anus).   Screening for testicular cancer.  Do a monthly exam of your testicles. Gently roll each testicle between your thumb and fingers, feeling for any abnormal lumps. The best time to do this is after a hot shower or bath when the tissues are looser. Notify your caregivers of any lumps, tenderness or changes in size or shape immediately.

## 2017-08-26 LAB — COMPREHENSIVE METABOLIC PANEL
ALT: 27 IU/L (ref 0–44)
AST: 20 IU/L (ref 0–40)
Albumin/Globulin Ratio: 1.5 (ref 1.2–2.2)
Albumin: 4.6 g/dL (ref 3.5–5.5)
Alkaline Phosphatase: 32 IU/L — ABNORMAL LOW (ref 39–117)
BUN/Creatinine Ratio: 12 (ref 9–20)
BUN: 13 mg/dL (ref 6–24)
Bilirubin Total: 1.3 mg/dL — ABNORMAL HIGH (ref 0.0–1.2)
CO2: 26 mmol/L (ref 20–29)
CREATININE: 1.06 mg/dL (ref 0.76–1.27)
Calcium: 9.4 mg/dL (ref 8.7–10.2)
Chloride: 101 mmol/L (ref 96–106)
GFR calc Af Amer: 101 mL/min/{1.73_m2} (ref >59)
GFR, EST NON AFRICAN AMERICAN: 87 mL/min/{1.73_m2} (ref >59)
GLUCOSE: 86 mg/dL (ref 65–99)
Globulin, Total: 3.1 g/dL (ref 1.5–4.5)
Potassium: 4.8 mmol/L (ref 3.5–5.2)
Sodium: 138 mmol/L (ref 134–144)
Total Protein: 7.7 g/dL (ref 6.0–8.5)

## 2017-08-26 LAB — CBC WITH DIFFERENTIAL/PLATELET
BASOS: 1 %
Basophils Absolute: 0 10*3/uL (ref 0.0–0.2)
EOS (ABSOLUTE): 0.4 10*3/uL (ref 0.0–0.4)
EOS: 13 %
Hematocrit: 44 % (ref 37.5–51.0)
Hemoglobin: 15.5 g/dL (ref 13.0–17.7)
IMMATURE GRANULOCYTES: 0 %
Immature Grans (Abs): 0 10*3/uL (ref 0.0–0.1)
LYMPHS: 35 %
Lymphocytes Absolute: 1.2 10*3/uL (ref 0.7–3.1)
MCH: 30.1 pg (ref 26.6–33.0)
MCHC: 35.2 g/dL (ref 31.5–35.7)
MCV: 85 fL (ref 79–97)
MONOS ABS: 0.4 10*3/uL (ref 0.1–0.9)
Monocytes: 13 %
NEUTROS PCT: 38 %
Neutrophils Absolute: 1.3 10*3/uL — ABNORMAL LOW (ref 1.4–7.0)
PLATELETS: 173 10*3/uL (ref 150–450)
RBC: 5.15 x10E6/uL (ref 4.14–5.80)
RDW: 13.4 % (ref 12.3–15.4)
WBC: 3.3 10*3/uL — ABNORMAL LOW (ref 3.4–10.8)

## 2017-08-26 LAB — LIPID PANEL
CHOLESTEROL TOTAL: 166 mg/dL (ref 100–199)
Chol/HDL Ratio: 3.6 ratio (ref 0.0–5.0)
HDL: 46 mg/dL (ref >39)
LDL CALC: 109 mg/dL — AB (ref 0–99)
TRIGLYCERIDES: 56 mg/dL (ref 0–149)
VLDL CHOLESTEROL CAL: 11 mg/dL (ref 5–40)

## 2017-08-26 LAB — TSH: TSH: 0.59 u[IU]/mL (ref 0.450–4.500)

## 2018-04-15 ENCOUNTER — Encounter: Payer: Self-pay | Admitting: Family Medicine

## 2018-04-15 ENCOUNTER — Ambulatory Visit (INDEPENDENT_AMBULATORY_CARE_PROVIDER_SITE_OTHER): Payer: Managed Care, Other (non HMO) | Admitting: Family Medicine

## 2018-04-15 VITALS — BP 110/70 | HR 71 | Temp 98.5°F | Resp 16 | Wt 138.6 lb

## 2018-04-15 DIAGNOSIS — R112 Nausea with vomiting, unspecified: Secondary | ICD-10-CM

## 2018-04-15 DIAGNOSIS — J029 Acute pharyngitis, unspecified: Secondary | ICD-10-CM

## 2018-04-15 DIAGNOSIS — R6883 Chills (without fever): Secondary | ICD-10-CM

## 2018-04-15 DIAGNOSIS — R52 Pain, unspecified: Secondary | ICD-10-CM

## 2018-04-15 DIAGNOSIS — J069 Acute upper respiratory infection, unspecified: Secondary | ICD-10-CM

## 2018-04-15 DIAGNOSIS — R197 Diarrhea, unspecified: Secondary | ICD-10-CM

## 2018-04-15 LAB — POCT INFLUENZA A/B
INFLUENZA A, POC: NEGATIVE
Influenza B, POC: NEGATIVE

## 2018-04-15 MED ORDER — ONDANSETRON 4 MG PO TBDP: 4.0000 mg | ORAL_TABLET | Freq: Three times a day (TID) | ORAL | 0 refills | Status: DC | PRN

## 2018-04-15 NOTE — Patient Instructions (Signed)
Your flu swab is negative.   I suspect your symptoms are related to a viral illness and recommend treating your symptoms at this point.  Mucinex DM, Robitussin or Theraflu for congestion and cough, drink extra water, use salt water gargles for throat irritation and Tylenol or Ibuprofen for aches and pains.  You can take the Zofran for nausea. This will dissolve under your tongue.   If your symptoms are getting worse including if you develop lower abdominal pain then you should be seen again.   Call if you are not improving or if you get worse.

## 2018-04-15 NOTE — Progress Notes (Signed)
Subjective:  Bryan Ruiz is a 43 y.o. male who presents for a 24 hour history of chills, body aches, rhinorrhea, sore throat, dry cough, N/V/D.   States he was vomiting yesterday and last night. No vomiting this morning.  Diarrhea that is loose and brown and eventually watery looking last night. No diarrhea this morning. No blood or pus.   Denies dizziness, chest pain, palpitations, shortness of breath, abdominal pain, urinary symptoms.   No new foods. No travel.  States he is urinating and his urine is a light yellow. Does not feel dehydrated.   Treatment to date: Robitussin.  Denies sick contacts.  No other aggravating or relieving factors.  No other c/o.  ROS as in subjective.   Objective: Vitals:   04/15/18 1057  BP: 110/70  Pulse: 71  Resp: 16  Temp: 98.5 F (36.9 C)  SpO2: 98%    General appearance: Alert, WD/WN, no distress, mildly ill appearing                             Skin: warm, no rash                           Head: no sinus tenderness                            Eyes: conjunctiva normal, corneas clear, PERRLA                            Ears: pearly TMs, external ear canals normal                          Nose: septum midline, turbinates swollen, with erythema and clear discharge             Mouth/throat: MMM, tongue normal, mild pharyngeal erythema                           Neck: supple, no adenopathy, no thyromegaly, nontender                          Heart: RRR, normal S1, S2, no murmurs                         Lungs: CTA bilaterally, no wheezes, rales, or rhonchi   Abdomen: soft, non distended, normal BS, RLQ TTP no rebound or referred pain. Negative psoas and obturator. No palpable masses.    Extremities: no edema, pulses intact   Skin: warm and dry, no pallor or diaphoresis      Assessment: Non-intractable vomiting with nausea, unspecified vomiting type - Plan: ondansetron (ZOFRAN ODT) 4 MG disintegrating tablet  Chills - Plan: POCT Influenza  A/B  Sore throat - Plan: POCT Influenza A/B  Body aches - Plan: POCT Influenza A/B  Acute URI  Diarrhea, unspecified type    Plan: Flu swab is negative Discussed that his symptoms appear to be related to a viral etiology at this point.  Right lower quadrant tenderness without rebound, discussed unlikely possibility of appendicitis and he will continue to keep an eye on his symptoms.  Advised that if he does develop severe lower abdominal pain that he should be reevaluated.  Suggested symptomatic  OTC remedies.  Recommend staying well-hydrated.  Zofran prescribed for nausea. Nasal saline spray for congestion.  Tylenol or Ibuprofen OTC for fever and malaise.  Call/return in 2-3 days if symptoms aren't resolving or if he is worsening.

## 2018-06-17 ENCOUNTER — Ambulatory Visit (INDEPENDENT_AMBULATORY_CARE_PROVIDER_SITE_OTHER): Payer: Managed Care, Other (non HMO) | Admitting: Family Medicine

## 2018-06-17 ENCOUNTER — Other Ambulatory Visit: Payer: Self-pay

## 2018-06-17 ENCOUNTER — Encounter: Payer: Self-pay | Admitting: Family Medicine

## 2018-06-17 VITALS — Wt 140.0 lb

## 2018-06-17 DIAGNOSIS — S6992XA Unspecified injury of left wrist, hand and finger(s), initial encounter: Secondary | ICD-10-CM

## 2018-06-17 DIAGNOSIS — M25532 Pain in left wrist: Secondary | ICD-10-CM

## 2018-06-17 DIAGNOSIS — X501XXA Overexertion from prolonged static or awkward postures, initial encounter: Secondary | ICD-10-CM

## 2018-06-17 NOTE — Progress Notes (Signed)
   Subjective:    Patient ID: Bryan Ruiz, male    DOB: 1975-05-12, 43 y.o.   MRN: [id number]  HPI  Documentation for virtual audio and video telecommunications through Zoom encounter:  The patient was located in his car and in a parking lot. 2 patient identifiers used.  The provider was located at home.  The patient did consent to this visit and is aware of possible charges through their insurance for this visit.  The other persons participating in this telemedicine service were none.  Complains of a 2-3 week history of left wrist pain and swelling that started after twisting a filter in his ice maker. States he felt a pop. Pain is mostly lateral with some weakness in his left hand. No numbness. Pain is worse with certain movements such as supination and gripping.  States he has been wearing a brace. Tried ibuprofen for a couple of weeks and no improvement. Ice helps temporarily.   Denies fever, chills, cough, shortness of breath, N/V/D. No other arthralgias.   Reviewed allergies, medications, past medical, surgical, family, and social history.   Review of Systems Pertinent positives and negatives in the history of present illness.     Objective:   Physical Exam Wt 140 lb (63.5 kg)   BMI 20.67 kg/m   Alert and oriented and in no acute distress. He appears to have normal ROM of his left wrist but pain with certain movements including supination. Reports TTP to lateral wrist. Reports normal sensation but notes weakness in his left grip compared to right.       Assessment & Plan:  Injury of left wrist, initial encounter - Plan: Ambulatory referral to Orthopedic Surgery  Left wrist pain - Plan: Ambulatory referral to Orthopedic Surgery  Discussed limitations of video visit.  Left wrist pain and swelling with injury, not work related. Does not appear to be responding to conservative treatment with ice, NSAIDs and wrist splint.  He does not have an orthopedist so I will refer  him to [place] for further evaluation and treatment. It is my understanding that I am not able to send him for outpatient imaging due to current Covid-19 pandemic and CDC as well as [place] guidelines. Continue conservative treatment until seen by orthopedist.   This virtual service is not related to other E/M service within previous 7 days.

## 2018-06-19 ENCOUNTER — Other Ambulatory Visit: Payer: Self-pay

## 2018-06-19 ENCOUNTER — Encounter (INDEPENDENT_AMBULATORY_CARE_PROVIDER_SITE_OTHER): Payer: Self-pay | Admitting: Orthopaedic Surgery

## 2018-06-19 ENCOUNTER — Ambulatory Visit (INDEPENDENT_AMBULATORY_CARE_PROVIDER_SITE_OTHER): Payer: Managed Care, Other (non HMO) | Admitting: Orthopaedic Surgery

## 2018-06-19 ENCOUNTER — Ambulatory Visit (INDEPENDENT_AMBULATORY_CARE_PROVIDER_SITE_OTHER): Payer: Managed Care, Other (non HMO)

## 2018-06-19 DIAGNOSIS — S6982XA Other specified injuries of left wrist, hand and finger(s), initial encounter: Secondary | ICD-10-CM | POA: Diagnosis not present

## 2018-06-19 MED ORDER — METHYLPREDNISOLONE ACETATE 40 MG/ML IJ SUSP
13.3300 mg | INTRAMUSCULAR | Status: AC | PRN
  Administered 2018-06-19: 13.33 mg via INTRA_ARTICULAR

## 2018-06-19 MED ORDER — LIDOCAINE HCL 1 % IJ SOLN
1.0000 mL | INTRAMUSCULAR | Status: AC | PRN
  Administered 2018-06-19: 1 mL

## 2018-06-19 MED ORDER — MELOXICAM 7.5 MG PO TABS: 7.5000 mg | ORAL_TABLET | Freq: Every day | ORAL | 0 refills | Status: AC | PRN

## 2018-06-19 NOTE — Progress Notes (Signed)
Office Visit Note   Patient: Bryan Ruiz           Date of Birth: [birth year]           MRN: [id number] Visit Date: 06/19/2018              Requested by: Girtha Rm, NP-C [place], [place] [id number] PCP: Girtha Rm, NP-C   Assessment & Plan: Visit Diagnoses:  1. 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  We will inject this with cortisone today and place the patient in a removable wrist splint.  I will also call him in a prescription of meloxicam.  A work note was provided today for desk work for the next 4 weeks.  He will follow-up with [place] in 4 weeks time for recheck.  Call with concerns or questions in the meantime.  Follow-Up Instructions: Return in about 4 weeks (around 07/17/2018).   Orders:  Orders Placed This Encounter  Procedures  . Hand/UE Inj  . XR Wrist Complete Left   Meds ordered this encounter  Medications  . meloxicam (MOBIC) 7.5 MG tablet    Sig: Take 1 tablet (7.5 mg total) by mouth daily as needed for up to 28 days for pain.    Dispense:  28 tablet    Refill:  0      Procedures: Medium Joint Inj: L radiocarpal on 06/19/2018 8:57 PM Details: 25 G needle Medications: 13.33 mg methylPREDNISolone acetate 40 MG/ML; 1 mL lidocaine 1 %      Clinical Data: No additional findings.   Subjective: Chief Complaint  Patient presents with  . Left Wrist - Pain    HPI patient is a pleasant 43 year old ambidextrous gentleman who presents our clinic today with left wrist pain.  This began approximately 1 month ago after he twisted his wrist.  Since then he has had pain to the area of the ulnar styloid.  Pain is worse with gripping and lifting.  Feels that he has lost some grip strength.  He has been taking ibuprofen without relief of symptoms.  He does note slight tingling to the tip of the small finger.  Review of Systems as detailed in HPI.   All others reviewed and are negative.   Objective: Vital Signs: There were no vitals taken for this visit.  Physical Exam well-developed well-nourished gentleman in no acute distress.  Alert and oriented x3.  Ortho Exam examination of his left wrist reveals minimal swelling.  Marked tenderness to the TFCC.  Increased pain with wrist flexion and ulnar deviation.  Decreased grip strength secondary to pain.  He is neurovascularly intact distally.  Specialty Comments:  No specialty comments available.  Imaging: Xr Wrist Complete Left  Result Date: 06/19/2018 No acute or structural abnormalities    PMFS History: Patient Active Problem List   Diagnosis Date Noted  . 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 Onychomycosis   . History of tinea cruris   . History of vertigo 03/05/2007   Past Medical History:  Diagnosis Date  . History of tinea cruris   . History of vertigo 2009   once prior as of 11/2014   . Onychomycosis     Family History  Problem Relation Age of Onset  . Diabetes Mother   . Hypertension Mother   . Diabetes Maternal Grandmother   . Kidney disease Maternal Grandmother   .  Prostate cancer Father 40       surgery   . Hypertension Father     Past Surgical History:  Procedure Laterality Date  . VASECTOMY     Social History   Occupational History  . Not on file  Tobacco Use  . Smoking status: Never Smoker  . Smokeless tobacco: Never Used  Substance and Sexual Activity  . Alcohol use: No  . Drug use: No  . Sexual activity: Yes    Birth control/protection: Condom, Surgical

## 2018-07-17 ENCOUNTER — Ambulatory Visit: Payer: Self-pay | Admitting: Orthopaedic Surgery

## 2018-07-29 ENCOUNTER — Encounter: Payer: Self-pay | Admitting: Family Medicine

## 2018-07-29 ENCOUNTER — Other Ambulatory Visit: Payer: Self-pay

## 2018-07-29 ENCOUNTER — Ambulatory Visit (INDEPENDENT_AMBULATORY_CARE_PROVIDER_SITE_OTHER): Payer: Managed Care, Other (non HMO) | Admitting: Family Medicine

## 2018-07-29 VITALS — Temp 97.1°F | Wt 140.0 lb

## 2018-07-29 DIAGNOSIS — W57XXXA Bitten or stung by nonvenomous insect and other nonvenomous arthropods, initial encounter: Secondary | ICD-10-CM | POA: Diagnosis not present

## 2018-07-29 DIAGNOSIS — L282 Other prurigo: Secondary | ICD-10-CM | POA: Diagnosis not present

## 2018-07-29 MED ORDER — TRIAMCINOLONE ACETONIDE 0.1 % EX CREA: 1.0000 "application " | TOPICAL_CREAM | Freq: Two times a day (BID) | CUTANEOUS | 0 refills | Status: DC

## 2018-07-29 NOTE — Progress Notes (Addendum)
   Subjective:   Documentation for virtual audio and video telecommunications through [place] encounter:  The patient was located at work.  The provider was located in the office. The patient did consent to this visit and is aware of possible charges through their insurance for this visit.  The other persons participating in this telemedicine service were none.    Patient ID: Bryan Ruiz, male    DOB: 01-05-1976, 43 y.o.   MRN: [id number]  HPI Chief Complaint  Patient presents with  . bed bugs    bed bugs, red bumps- in different spots on body, itch. went to cabin over weekend,    Complains of a pruritic rash after sleeping in a cabin in [place] last weekend. States he saw bed bugs in the bed he was sleeping in. Reports having a rash on his arms, back, buttocks.   Denies fever, chills, N/V.   States he used OTC hydrocortisone cream but it is not helping much. Taking hot showers.   Reviewed allergies, medications, past medical, surgical, family, and social history.    Review of Systems Pertinent positives and negatives in the history of present illness.     Objective:   Physical Exam Temp (!) 97.1 F (36.2 C) (Oral)   Wt 140 lb (63.5 kg)   BMI 20.67 kg/m   Alert and oriented and in no acute distress. Unable to visualize rash due to him being at work and the areas being covered. Denies infection.       Assessment & Plan:  Pruritic rash - Plan: triamcinolone cream (KENALOG) 0.1 %  Bedbug bite, initial encounter  Discussed limitations of virtual visit.  Fortunately the bedbugs were in the cabin he and his family stating over the weekend and not in his home.  He does feel comfortable that he dispose of the clothing he took to the cabin and did not bring any bedbugs home with him. Prescribed triamcinolone cream. He may take a non drowsy antihistamine during the day if needed and Benadryl at bedtime. Advised to take cool showers and baths.  Advised to avoid  scratching to prevent secondary infection.   Time spent on call was 13 minutes and in review of previous records 1 minutes total.  This virtual service is not related to other E/M service within previous 7 days.

## 2019-01-06 ENCOUNTER — Telehealth: Payer: Self-pay | Admitting: Family Medicine

## 2019-01-06 NOTE — Telephone Encounter (Signed)
Dismissal letter in guarantor snapshot  °

## 2019-02-05 ENCOUNTER — Other Ambulatory Visit: Payer: Self-pay

## 2019-02-05 ENCOUNTER — Emergency Department (HOSPITAL_COMMUNITY)
Admission: EM | Admit: 2019-02-05 | Discharge: 2019-02-05 | Disposition: A | Discharge status: Home / Self Care | Payer: No Typology Code available for payment source | Attending: Emergency Medicine | Admitting: Emergency Medicine

## 2019-02-05 ENCOUNTER — Emergency Department (HOSPITAL_BASED_OUTPATIENT_CLINIC_OR_DEPARTMENT_OTHER)
Admission: EM | Admit: 2019-02-05 | Discharge: 2019-02-05 | Disposition: A | Discharge status: Home / Self Care | Payer: No Typology Code available for payment source | Attending: Emergency Medicine | Admitting: Emergency Medicine

## 2019-02-05 ENCOUNTER — Emergency Department (HOSPITAL_BASED_OUTPATIENT_CLINIC_OR_DEPARTMENT_OTHER): Payer: No Typology Code available for payment source

## 2019-02-05 ENCOUNTER — Encounter (HOSPITAL_BASED_OUTPATIENT_CLINIC_OR_DEPARTMENT_OTHER): Payer: Self-pay

## 2019-02-05 DIAGNOSIS — R519 Headache, unspecified: Secondary | ICD-10-CM | POA: Insufficient documentation

## 2019-02-05 DIAGNOSIS — R112 Nausea with vomiting, unspecified: Secondary | ICD-10-CM | POA: Diagnosis not present

## 2019-02-05 DIAGNOSIS — M542 Cervicalgia: Secondary | ICD-10-CM | POA: Diagnosis not present

## 2019-02-05 DIAGNOSIS — R42 Dizziness and giddiness: Secondary | ICD-10-CM | POA: Insufficient documentation

## 2019-02-05 DIAGNOSIS — Z5321 Procedure and treatment not carried out due to patient leaving prior to being seen by health care provider: Secondary | ICD-10-CM | POA: Insufficient documentation

## 2019-02-05 DIAGNOSIS — S0990XA Unspecified injury of head, initial encounter: Secondary | ICD-10-CM

## 2019-02-05 MED ORDER — ONDANSETRON 4 MG PO TBDP: 4.0000 mg | ORAL_TABLET | Freq: Three times a day (TID) | ORAL | 0 refills | Status: DC | PRN

## 2019-02-05 MED ORDER — ONDANSETRON 8 MG PO TBDP
8.0000 mg | ORAL_TABLET | Freq: Once | ORAL | Status: AC
  Administered 2019-02-05: 8 mg via ORAL
  Filled 2019-02-05: qty 1

## 2019-02-05 MED ORDER — METHOCARBAMOL 500 MG PO TABS: 500.0000 mg | ORAL_TABLET | Freq: Two times a day (BID) | ORAL | 0 refills | Status: DC

## 2019-02-05 NOTE — ED Provider Notes (Signed)
[facility] EMERGENCY DEPARTMENT Provider Note   CSN: [id number] Arrival date & time: 02/05/19  1752     History   Chief Complaint Chief Complaint  Patient presents with   Head Injury    HPI Bryan Ruiz is a 43 y.o. male.     HPI  Bryan Ruiz is a 43 y.o. male, patient with no pertinent past medical history, presenting to the ED with head injury that occurred yesterday afternoon. Patient stood up from a crouched position and hit the top of his head on a metal piece of equipment.  He has been experiencing bilateral headaches, throbbing, moderate to severe, radiating throughout the head.  Accompanied by nausea, lightheadedness, and some neck pain. He has taken 1 dose of ibuprofen.  Denies LOC, vomiting, numbness, weakness, confusion, or any other complaints.   Past Medical History:  Diagnosis Date   History of tinea cruris    History of vertigo 2009   once prior as of 11/2014    Onychomycosis     Patient Active Problem List   Diagnosis Date Noted   Injury of triangular fibrocartilage complex of left wrist 06/19/2018   Onychomycosis    History of tinea cruris    History of vertigo 03/05/2007    Past Surgical History:  Procedure Laterality Date   VASECTOMY          Home Medications    Prior to Admission medications   Medication Sig Start Date End Date Taking? Authorizing Provider  methocarbamol (ROBAXIN) 500 MG tablet Take 1 tablet (500 mg total) by mouth 2 (two) times daily. 02/05/19   Charnae Lill C, PA-C  ondansetron (ZOFRAN ODT) 4 MG disintegrating tablet Take 1 tablet (4 mg total) by mouth every 8 (eight) hours as needed for nausea or vomiting. 02/05/19   Makylee Sanborn C, PA-C  triamcinolone cream (KENALOG) 0.1 % Apply 1 application topically 2 (two) times daily. 07/29/18   Girtha Rm, NP-C    Family History Family History  Problem Relation Age of Onset   Diabetes Mother    Hypertension Mother    Diabetes Maternal  Grandmother    Kidney disease Maternal Grandmother    Prostate cancer Father 70       surgery    Hypertension Father     Social History Social History   Tobacco Use   Smoking status: Never Smoker   Smokeless tobacco: Never Used  Substance Use Topics   Alcohol use: No   Drug use: No     Allergies   Patient has no known allergies.   Review of Systems Review of Systems  Constitutional: Negative for diaphoresis.  Respiratory: Negative for shortness of breath.   Cardiovascular: Negative for chest pain.  Gastrointestinal: Positive for nausea. Negative for vomiting.  Musculoskeletal: Positive for neck pain.  Neurological: Positive for light-headedness and headaches. Negative for syncope, weakness and numbness.  Psychiatric/Behavioral: Negative for confusion.  All other systems reviewed and are negative.    Physical Exam Updated Vital Signs BP 111/69 (BP Location: Right Arm)    Pulse 67    Temp 98.3 F (36.8 C) (Oral)    Resp 18    Ht 5\' 10"  (1.778 m)    Wt 62.1 kg    SpO2 99%    BMI 19.66 kg/m   Physical Exam Vitals signs and nursing note reviewed.  Constitutional:      General: He is not in acute distress.    Appearance: He is well-developed. He is not diaphoretic.  HENT:     Head: Normocephalic.     Comments: Tenderness to the bilateral superior parietal scalp without swelling, wounds, deformity, or instability. No other signs of injury to the rest of the scalp or to the face.    Mouth/Throat:     Mouth: Mucous membranes are moist.     Pharynx: Oropharynx is clear.  Eyes:     Extraocular Movements: Extraocular movements intact.     Conjunctiva/sclera: Conjunctivae normal.     Pupils: Pupils are equal, round, and reactive to light.  Neck:     Musculoskeletal: Normal range of motion and neck supple. Muscular tenderness present. No edema, erythema or crepitus.   Cardiovascular:     Rate and Rhythm: Normal rate and regular rhythm.     Pulses: Normal  pulses.  Pulmonary:     Effort: Pulmonary effort is normal. No respiratory distress.  Abdominal:     Tenderness: There is no guarding.  Skin:    General: Skin is warm and dry.  Neurological:     Mental Status: He is alert and oriented to person, place, and time.     Comments: Sensation grossly intact to light touch in the extremities.  Grip strengths equal bilaterally.  Strength 5/5 in all extremities. No gait disturbance. Coordination intact. Cranial nerves III-XII grossly intact. No facial droop.   Psychiatric:        Mood and Affect: Mood and affect normal.        Speech: Speech normal.        Behavior: Behavior normal.      ED Treatments / Results  Labs (all labs ordered are listed, but only abnormal results are displayed) Labs Reviewed - No data to display  EKG None  Radiology Ct Head Wo Contrast  Result Date: 02/05/2019 CLINICAL DATA:  Headache, posttraumatic EXAM: CT HEAD WITHOUT CONTRAST TECHNIQUE: Contiguous axial images were obtained from the base of the skull through the vertex without intravenous contrast. COMPARISON:  August 07, 2008 FINDINGS: Brain: No evidence of acute territorial infarction, hemorrhage, hydrocephalus,extra-axial collection or mass lesion/mass effect. Normal gray-white differentiation. Ventricles are normal in size and contour. Vascular: No hyperdense vessel or unexpected calcification. Skull: The skull is intact. No fracture is seen. Again noted is an osseous protuberance off the right frontal skull measuring 1.4 cm in transverse dimension. 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 Sinuses/Orbits: The visualized paranasal sinuses and mastoid air cells are clear. The orbits and globes intact. Other: None Cervical spine: Alignment: Physiologic Skull base and vertebrae: Visualized skull base is intact. No atlanto-occipital dissociation. The vertebral body heights are well maintained. No fracture or pathologic osseous lesion  seen. Soft tissues and spinal canal: The visualized paraspinal soft tissues are unremarkable. No prevertebral soft tissue swelling is seen. The spinal canal is grossly unremarkable, no large epidural collection or significant canal narrowing. Disc levels: Mild cervical spine spondylosis is seen most notable at C5-C6 and C6-C7. However no significant canal or neural foraminal narrowing is seen. Upper chest: The lung apices are clear. Thoracic inlet is within normal limits. Other: None IMPRESSION: No acute intracranial abnormality. No acute fracture or malalignment of the spine. Electronically Signed   By: Prudencio Pair M.D.   On: 02/05/2019 20:22   Ct Cervical Spine Wo Contrast  Result Date: 02/05/2019 CLINICAL DATA:  Headache, posttraumatic EXAM: CT HEAD WITHOUT CONTRAST TECHNIQUE: Contiguous axial images were obtained from the base of the skull through the vertex without intravenous contrast. COMPARISON:  August 07, 2008 FINDINGS: Brain: No evidence of acute territorial infarction, hemorrhage, hydrocephalus,extra-axial collection or mass lesion/mass effect. Normal gray-white differentiation. Ventricles are normal in size and contour. Vascular: No hyperdense vessel or unexpected calcification. Skull: The skull is intact. No fracture is seen. Again noted is an osseous protuberance off the right frontal skull measuring 1.4 cm in transverse dimension. 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 Sinuses/Orbits: The visualized paranasal sinuses and mastoid air cells are clear. The orbits and globes intact. Other: None Cervical spine: Alignment: Physiologic Skull base and vertebrae: Visualized skull base is intact. No atlanto-occipital dissociation. The vertebral body heights are well maintained. No fracture or pathologic osseous lesion seen. Soft tissues and spinal canal: The visualized paraspinal soft tissues are unremarkable. No prevertebral soft tissue swelling is seen. The spinal  canal is grossly unremarkable, no large epidural collection or significant canal narrowing. Disc levels: Mild cervical spine spondylosis is seen most notable at C5-C6 and C6-C7. However no significant canal or neural foraminal narrowing is seen. Upper chest: The lung apices are clear. Thoracic inlet is within normal limits. Other: None IMPRESSION: No acute intracranial abnormality. No acute fracture or malalignment of the spine. Electronically Signed   By: Prudencio Pair M.D.   On: 02/05/2019 20:22    Procedures Procedures (including critical care time)  Medications Ordered in ED Medications  ondansetron (ZOFRAN-ODT) disintegrating tablet 8 mg (8 mg Oral Given 02/05/19 2118)     Initial Impression / Assessment and Plan / ED Course  I have reviewed the triage vital signs and the nursing notes.  Pertinent labs & imaging results that were available during my care of the patient were reviewed by me and considered in my medical decision making (see chart for details).        Patient presents for evaluation following a head injury that occurred yesterday. No red flag symptoms.  No acute abnormalities on CT scans.  No focal neuro deficits. The patient was given instructions for home care as well as return precautions. Patient voices understanding of these instructions, accepts the plan, and is comfortable with discharge.   Final Clinical Impressions(s) / ED Diagnoses   Final diagnoses:  Injury of head, initial encounter    ED Discharge Orders         Ordered    ondansetron (ZOFRAN ODT) 4 MG disintegrating tablet  Every 8 hours PRN     02/05/19 2114    methocarbamol (ROBAXIN) 500 MG tablet  2 times daily     02/05/19 2114           Lorayne Bender, PA-C 02/06/19 0005    Tegeler, Gwenyth Allegra, MD 02/06/19 [phone]

## 2019-02-05 NOTE — ED Notes (Signed)
Pt stated he wanted to go to a different hospital due to wait times. Seen leaving ED with steady gait.

## 2019-02-05 NOTE — ED Triage Notes (Signed)
Pt got in the head by a conveyor yesterday at work. Pt was knocked to his knees but did not lose consciousness. Today pt having intermittent sharp headache and pain in neck and back and says he gets lightheaded with any fast motion. Pt ambulatory. A/O x4.

## 2019-02-05 NOTE — ED Triage Notes (Addendum)
Pt states he had head injury at work yesterday ~1pm-he was squatted and stood-struck top of head on metal conveyer-no LOC but states he was "knocked back down"-pain to top of head, neck-feeling dizzy, light headed-was seen at [facility] sent to ED for eval-no break in skin noted to pain site-NAD-steady gait

## 2019-02-05 NOTE — Discharge Instructions (Addendum)
°  Head Injury You have been seen today for a head injury. It does not appear to be serious at this time.  Close observation: The close observation period is usually 6 hours from the injury. This includes staying awake and having a trustworthy adult monitor you to assure your condition does not worsen. You should be in regular contact with this person and ideally, they should be able to monitor you in person.  Secondary observation: The secondary observation period is usually 24 hours from the injury. You are allowed to sleep during this time. A trustworthy adult should intermittently monitor you to assure your condition does not worsen.   Overall head injury/concussion care: Rest: Be sure to get plenty of rest. You will need more rest and sleep while you recover. Hydration: Be sure to stay well hydrated by having a goal of drinking about 0.5 liters of water an hour. Pain:  Antiinflammatory medications: Take 600 mg of ibuprofen every 6 hours or 440 mg (over the counter dose) to 500 mg (prescription dose) of naproxen every 12 hours or for the next 3 days. After this time, these medications may be used as needed for pain. Take these medications with food to avoid upset stomach. Choose only one of these medications, do not take them together. Tylenol: Should you continue to have additional pain while taking the ibuprofen or naproxen, you may add in tylenol as needed. Your daily total maximum amount of tylenol from all sources should be limited to 4000mg /day for persons without liver problems, or 2000mg /day for those with liver problems. Methocarbamol: Methocarbamol (generic for Robaxin) is a muscle relaxer and can help relieve stiff muscles or muscle spasms.  Do not drive or perform other dangerous activities while taking this medication as it can cause drowsiness as well as changes in reaction time and judgement. Nausea/vomiting: Use the ondansetron (generic for Zofran) for nausea or vomiting.  This  medication may not prevent all vomiting or nausea, but can help facilitate better hydration. Things that can help with nausea/vomiting also include peppermint/menthol candies, vitamin B12, and ginger. Return to sports and activities: In general, you may return to normal activities once symptoms have subsided, however, you would ideally be cleared by a primary care provider or other qualified medical professional prior to return to these activities.  Follow up: Follow up with the concussion clinic or your primary care provider for further management of this issue. Return: Return to the ED should you begin to have confusion, abnormal behavior, aggression, violence, or personality changes, repeated vomiting, vision loss, numbness or weakness on one side of the body, difficulty standing due to dizziness, significantly worsening pain, or any other major concerns.

## 2019-02-05 NOTE — ED Notes (Signed)
Pt drank po sprite. No nausea

## 2019-05-31 ENCOUNTER — Ambulatory Visit: Payer: Managed Care, Other (non HMO) | Attending: Internal Medicine

## 2019-05-31 DIAGNOSIS — Z23 Encounter for immunization: Secondary | ICD-10-CM

## 2019-05-31 NOTE — Progress Notes (Signed)
   Covid-19 Vaccination Clinic  Name:  Bryan Ruiz    MRN: [id number] DOB: [birth year]  05/31/2019  [name] was observed post Covid-19 immunization for 15 minutes without incident. He was provided with Vaccine Information Sheet and instruction to access the V-Safe system.   [name] was instructed to call 911 with any severe reactions post vaccine: Marland Kitchen Difficulty breathing  . Swelling of face and throat  . A fast heartbeat  . A bad rash all over body  . Dizziness and weakness   Immunizations Administered    Name Date Dose VIS Date Route   Pfizer COVID-19 Vaccine 05/31/2019  8:19 AM 0.3 mL 02/12/2019 Intramuscular   Manufacturer: [place]   Lot: [id number]   [place]: [id number]

## 2019-06-22 ENCOUNTER — Ambulatory Visit: Payer: Managed Care, Other (non HMO) | Attending: Internal Medicine

## 2019-06-22 DIAGNOSIS — Z23 Encounter for immunization: Secondary | ICD-10-CM

## 2019-06-22 NOTE — Progress Notes (Signed)
   Covid-19 Vaccination Clinic  Name:  Tory Hibbert    MRN: [id number] DOB: 12/10/1975  06/22/2019  Mr. Mom was observed post Covid-19 immunization for 15 minutes without incident. He was provided with Vaccine Information Sheet and instruction to access the V-Safe system.   Mr. Schu was instructed to call 911 with any severe reactions post vaccine: Marland Kitchen Difficulty breathing  . Swelling of face and throat  . A fast heartbeat  . A bad rash all over body  . Dizziness and weakness   Immunizations Administered    Name Date Dose VIS Date Route   Pfizer COVID-19 Vaccine 06/22/2019 11:31 AM 0.3 mL 04/28/2018 Intramuscular   Manufacturer: [place]   Lot: [id number]   [place]: [id number]

## 2019-06-29 ENCOUNTER — Other Ambulatory Visit: Payer: Self-pay

## 2019-06-29 ENCOUNTER — Encounter: Payer: Self-pay | Admitting: Family Medicine

## 2019-06-29 ENCOUNTER — Ambulatory Visit (INDEPENDENT_AMBULATORY_CARE_PROVIDER_SITE_OTHER): Payer: Managed Care, Other (non HMO) | Admitting: Family Medicine

## 2019-06-29 VITALS — BP 120/68 | HR 88 | Temp 97.1°F | Ht 70.0 in | Wt 142.0 lb

## 2019-06-29 DIAGNOSIS — Z13220 Encounter for screening for lipoid disorders: Secondary | ICD-10-CM

## 2019-06-29 DIAGNOSIS — B351 Tinea unguium: Secondary | ICD-10-CM | POA: Diagnosis not present

## 2019-06-29 DIAGNOSIS — Z1329 Encounter for screening for other suspected endocrine disorder: Secondary | ICD-10-CM | POA: Diagnosis not present

## 2019-06-29 DIAGNOSIS — Z0000 Encounter for general adult medical examination without abnormal findings: Secondary | ICD-10-CM

## 2019-06-29 NOTE — Patient Instructions (Addendum)
I will be in touch with your results.     Preventive Care 2-42 Years Old, Male Preventive care refers to lifestyle choices and visits with your health care provider that can promote health and wellness. This includes:  A yearly physical exam. This is also called an annual well check.  Regular dental and eye exams.  Immunizations.  Screening for certain conditions.  Healthy lifestyle choices, such as eating a healthy diet, getting regular exercise, not using drugs or products that contain nicotine and tobacco, and limiting alcohol use. What can I expect for my preventive care visit? Physical exam Your health care provider will check:  Height and weight. These may be used to calculate body mass index (BMI), which is a measurement that tells if you are at a healthy weight.  Heart rate and blood pressure.  Your skin for abnormal spots. Counseling Your health care provider may ask you questions about:  Alcohol, tobacco, and drug use.  Emotional well-being.  Home and relationship well-being.  Sexual activity.  Eating habits.  Work and work Statistician. What immunizations do I need?  Influenza (flu) vaccine  This is recommended every year. Tetanus, diphtheria, and pertussis (Tdap) vaccine  You may need a Td booster every 10 years. Varicella (chickenpox) vaccine  You may need this vaccine if you have not already been vaccinated. Zoster (shingles) vaccine  You may need this after age 49. Measles, mumps, and rubella (MMR) vaccine  You may need at least one dose of MMR if you were born in 1957 or later. You may also need a second dose. Pneumococcal conjugate (PCV13) vaccine  You may need this if you have certain conditions and were not previously vaccinated. Pneumococcal polysaccharide (PPSV23) vaccine  You may need one or two doses if you smoke cigarettes or if you have certain conditions. Meningococcal conjugate (MenACWY) vaccine  You may need this if you  have certain conditions. Hepatitis A vaccine  You may need this if you have certain conditions or if you travel or work in places where you may be exposed to hepatitis A. Hepatitis B vaccine  You may need this if you have certain conditions or if you travel or work in places where you may be exposed to hepatitis B. Haemophilus influenzae type b (Hib) vaccine  You may need this if you have certain risk factors. Human papillomavirus (HPV) vaccine  If recommended by your health care provider, you may need three doses over 6 months. You may receive vaccines as individual doses or as more than one vaccine together in one shot (combination vaccines). Talk with your health care provider about the risks and benefits of combination vaccines. What tests do I need? Blood tests  Lipid and cholesterol levels. These may be checked every 5 years, or more frequently if you are over 2 years old.  Hepatitis C test.  Hepatitis B test. Screening  Lung cancer screening. You may have this screening every year starting at age 1 if you have a 30-pack-year history of smoking and currently smoke or have quit within the past 15 years.  Prostate cancer screening. Recommendations will vary depending on your family history and other risks.  Colorectal cancer screening. All adults should have this screening starting at age 29 and continuing until age 63. Your health care provider may recommend screening at age 12 if you are at increased risk. You will have tests every 1-10 years, depending on your results and the type of screening test.  Diabetes screening. This  is done by checking your blood sugar (glucose) after you have not eaten for a while (fasting). You may have this done every 1-3 years.  Sexually transmitted disease (STD) testing. Follow these instructions at home: Eating and drinking  Eat a diet that includes fresh fruits and vegetables, whole grains, lean protein, and low-fat dairy products.  Take  vitamin and mineral supplements as recommended by your health care provider.  Do not drink alcohol if your health care provider tells you not to drink.  If you drink alcohol: ? Limit how much you have to 0-2 drinks a day. ? Be aware of how much alcohol is in your drink. In the [place] one drink equals one 12 oz bottle of beer (355 mL), one 5 oz glass of wine (148 mL), or one 1 oz glass of hard liquor (44 mL). Lifestyle  Take daily care of your teeth and gums.  Stay active. Exercise for at least 30 minutes on 5 or more days each week.  Do not use any products that contain nicotine or tobacco, such as cigarettes, e-cigarettes, and chewing tobacco. If you need help quitting, ask your health care provider.  If you are sexually active, practice safe sex. Use a condom or other form of protection to prevent STIs (sexually transmitted infections).  Talk with your health care provider about taking a low-dose aspirin every day starting at age 56. What's next?  Go to your health care provider once a year for a well check visit.  Ask your health care provider how often you should have your eyes and teeth checked.  Stay up to date on all vaccines. This information is not intended to replace advice given to you by your health care provider. Make sure you discuss any questions you have with your health care provider. Document Revised: 02/12/2018 Document Reviewed: 02/12/2018 Elsevier Patient Education  2020 [employer].

## 2019-06-29 NOTE — Progress Notes (Signed)
Subjective:    Patient ID: Bryan Ruiz, male    DOB: 1975-03-14, 44 y.o.   MRN: [id number]  HPI Chief Complaint  Patient presents with  . cpe    cpe, no other concerns   He is here for a complete physical exam. Previous medical care: Last CPE: 2019  Other providers: Dr. Amalia Hailey - podiatrist   ?father colon cancer. He will find out and let me know.   Complains of worsening toe nail fungus on both feet. Has tried oral antifungal. Has not followed up with podiatrist.   Social history: Lives with his male partner, 5 kids, works as Merchant navy officer at [employer]  Denies smoking, drinking alcohol, drug use Diet: fairly healthy  Exercise: none outside of work   Immunizations: Tdap in the past 3-4 years  Received Covid vaccines. Last one 06/22/2019 Pfizer  Health maintenance:  Colonoscopy: never  Last PSA: never  Last Dental Exam: 2 months ago  Last Eye Exam: last year   Wears seatbelt always,  smoke detectors in home and functioning, does not text while driving, feels safe in home environment.  Reviewed allergies, medications, past medical, surgical, family, and social history.    Review of Systems Review of Systems Constitutional: -fever, -chills, -sweats, -unexpected weight change,-fatigue ENT: -runny nose, -ear pain, -sore throat Cardiology:  -chest pain, -palpitations, -edema Respiratory: -cough, -shortness of breath, -wheezing Gastroenterology: -abdominal pain, -nausea, -vomiting, -diarrhea, -constipation  Hematology: -bleeding or bruising problems Musculoskeletal: -arthralgias, -myalgias, -joint swelling, -back pain Ophthalmology: -vision changes Urology: -dysuria, -difficulty urinating, -hematuria, -urinary frequency, -urgency Neurology: -headache, -weakness, -tingling, -numbness      Objective:   Physical Exam BP 120/68   Pulse 88   Temp (!) 97.1 F (36.2 C)   Ht 5\' 10"  (1.778 m)   Wt 142 lb (64.4 kg)   BMI 20.37 kg/m   General Appearance:    Alert,  cooperative, no distress, appears stated age  Head:    Normocephalic, without obvious abnormality, atraumatic  Eyes:    PERRL, conjunctiva/corneas clear, EOM's intact, fundi    benign  Ears:    Normal TM's and external ear canals  Nose:   Mask on   Throat:   Mask on   Neck:   Supple, no lymphadenopathy;  thyroid:  no   enlargement/tenderness/nodules; no JVD  Back:    Spine nontender, no curvature, ROM normal, no CVA     tenderness  Lungs:     Clear to auscultation bilaterally without wheezes, rales or     ronchi; respirations unlabored  Chest Wall:    No tenderness or deformity   Heart:    Regular rate and rhythm, S1 and S2 normal, no murmur, rub   or gallop  Breast Exam:    No chest wall tenderness, masses or gynecomastia  Abdomen:     Soft, non-tender, nondistended, normoactive bowel sounds,    no masses, no hepatosplenomegaly  Genitalia:    Declines      Extremities:   No clubbing, cyanosis or edema  Pulses:   2+ and symmetric all extremities  Skin:   Skin color, texture, turgor normal, no rashes or lesions. Bilateral toe nails with thickening, long and yellowish color.   Lymph nodes:   Cervical, supraclavicular, and axillary nodes normal  Neurologic:   CNII-XII intact, normal strength, sensation and gait; reflexes 2+ and symmetric throughout          Psych:   Normal mood, affect, hygiene and grooming.  Assessment & Plan:  Routine general medical examination at a health care facility - Plan: CBC with Differential/Platelet, Comprehensive metabolic panel, TSH, T4, free, Lipid panel  Onychomycosis  Screening for thyroid disorder - Plan: TSH, T4, free  Screening for lipid disorders - Plan: Lipid panel  He is here for a fasting CPE.  He is in his usual state of health.  Discussed preventive healthcare.  There is a possibility his father has colon cancer.   Malek will find out and if his father does indeed have colon cancer, he is aware that he may need to have his first  colonoscopy sooner than 44 years old per guidelines.  Declines GU exam.  I did encourage regular self testicular exams.  Continue with annual eye exams and biannual dental exams.  Counseling on healthy diet and exercise.  Immunizations reviewed.  He is up-to-date on his Covid vaccines.  Discussed safety and health promotion. He is troubled by worsening onychomycosis.  He has been under the care of a podiatrist for this.  I recommend that he follow-up for further evaluation and treatment options

## 2019-06-30 LAB — COMPREHENSIVE METABOLIC PANEL
ALT: 12 IU/L (ref 0–44)
AST: 16 IU/L (ref 0–40)
Albumin/Globulin Ratio: 1.5 (ref 1.2–2.2)
Albumin: 4.3 g/dL (ref 4.0–5.0)
Alkaline Phosphatase: 37 IU/L — ABNORMAL LOW (ref 39–117)
BUN/Creatinine Ratio: 11 (ref 9–20)
BUN: 12 mg/dL (ref 6–24)
Bilirubin Total: 1.3 mg/dL — ABNORMAL HIGH (ref 0.0–1.2)
CO2: 26 mmol/L (ref 20–29)
Calcium: 9.3 mg/dL (ref 8.7–10.2)
Chloride: 102 mmol/L (ref 96–106)
Creatinine, Ser: 1.07 mg/dL (ref 0.76–1.27)
GFR calc Af Amer: 98 mL/min/{1.73_m2} (ref >59)
GFR calc non Af Amer: 85 mL/min/{1.73_m2} (ref >59)
Globulin, Total: 2.9 g/dL (ref 1.5–4.5)
Glucose: 99 mg/dL (ref 65–99)
Potassium: 4.2 mmol/L (ref 3.5–5.2)
Sodium: 139 mmol/L (ref 134–144)
Total Protein: 7.2 g/dL (ref 6.0–8.5)

## 2019-06-30 LAB — CBC WITH DIFFERENTIAL/PLATELET
Basophils Absolute: 0.1 10*3/uL (ref 0.0–0.2)
Basos: 1 %
EOS (ABSOLUTE): 0.3 10*3/uL (ref 0.0–0.4)
Eos: 9 %
Hematocrit: 42.2 % (ref 37.5–51.0)
Hemoglobin: 14.1 g/dL (ref 13.0–17.7)
Immature Grans (Abs): 0 10*3/uL (ref 0.0–0.1)
Immature Granulocytes: 0 %
Lymphocytes Absolute: 1.5 10*3/uL (ref 0.7–3.1)
Lymphs: 41 %
MCH: 29.5 pg (ref 26.6–33.0)
MCHC: 33.4 g/dL (ref 31.5–35.7)
MCV: 88 fL (ref 79–97)
Monocytes Absolute: 0.3 10*3/uL (ref 0.1–0.9)
Monocytes: 9 %
Neutrophils Absolute: 1.5 10*3/uL (ref 1.4–7.0)
Neutrophils: 40 %
Platelets: 216 10*3/uL (ref 150–450)
RBC: 4.78 x10E6/uL (ref 4.14–5.80)
RDW: 11.9 % (ref 11.6–15.4)
WBC: 3.7 10*3/uL (ref 3.4–10.8)

## 2019-06-30 LAB — LIPID PANEL
Chol/HDL Ratio: 3.5 ratio (ref 0.0–5.0)
Cholesterol, Total: 144 mg/dL (ref 100–199)
HDL: 41 mg/dL (ref >39)
LDL Chol Calc (NIH): 81 mg/dL (ref 0–99)
Triglycerides: 119 mg/dL (ref 0–149)
VLDL Cholesterol Cal: 22 mg/dL (ref 5–40)

## 2019-06-30 LAB — T4, FREE: Free T4: 1.33 ng/dL (ref 0.82–1.77)

## 2019-06-30 LAB — TSH: TSH: 0.498 u[IU]/mL (ref 0.450–4.500)

## 2019-06-30 NOTE — Progress Notes (Signed)
His labs are all fine including blood counts, electrolytes, thyroid, kidney, liver and cholesterol.

## 2019-10-13 ENCOUNTER — Encounter: Payer: Self-pay | Admitting: Family Medicine

## 2019-10-13 ENCOUNTER — Other Ambulatory Visit: Payer: Self-pay

## 2019-10-13 ENCOUNTER — Ambulatory Visit: Payer: Managed Care, Other (non HMO) | Admitting: Family Medicine

## 2019-10-13 VITALS — BP 118/68 | HR 88 | Temp 97.1°F | Resp 18 | Ht 69.0 in | Wt 138.8 lb

## 2019-10-13 DIAGNOSIS — B351 Tinea unguium: Secondary | ICD-10-CM | POA: Diagnosis not present

## 2019-10-13 DIAGNOSIS — W57XXXA Bitten or stung by nonvenomous insect and other nonvenomous arthropods, initial encounter: Secondary | ICD-10-CM | POA: Diagnosis not present

## 2019-10-13 DIAGNOSIS — S40862A Insect bite (nonvenomous) of left upper arm, initial encounter: Secondary | ICD-10-CM | POA: Diagnosis not present

## 2019-10-13 MED ORDER — TRIAMCINOLONE ACETONIDE 0.1 % EX CREA: 1.0000 "application " | TOPICAL_CREAM | Freq: Two times a day (BID) | CUTANEOUS | 0 refills | Status: DC

## 2019-10-13 NOTE — Progress Notes (Signed)
   Subjective:    Patient ID: Bryan Ruiz, male    DOB: [birth year], 44 y.o.   MRN: [id number]  HPI Chief Complaint  Patient presents with  . Rash    on left arm and left leg- started about 5 weeks ago. Used cream that you gave him last time but he is now out of it.    Complains of a pruritic rash on his left arm after an insect bite. States he uses triamcinolone usually when he has a pruritic bite but he is out. No ticks seen.   He would also like to see a podiatrist for onychomycosis. States he last saw Dr. Amalia Hailey over 3 years ago and his condition is getting worse.   No fever, chills, N/V/D.      Review of Systems Pertinent positives and negatives in the history of present illness.     Objective:   Physical Exam BP 118/68   Pulse 88   Temp (!) 97.1 F (36.2 C) (Tympanic)   Resp 18   Ht 5\' 9"  (1.753 m)   Wt 138 lb 12.8 oz (63 kg)   BMI 20.50 kg/m    Left anterior forearm with a pruritic red, raised bump and a wheal on his left posterior left upper arm.      Assessment & Plan:  Insect bite of left upper extremity, initial encounter - Plan: triamcinolone cream (KENALOG) 0.1 %  Onychomycosis - Plan: Ambulatory referral to Podiatry  No sign of infection. He may use the steroid cream and try to avoid future insect bites. May use Benadryl if needed and cool compresses for itching.  Referral back to podiatrist per his request for symptomatic onychomycosis since it has more than 3 years.

## 2019-10-19 ENCOUNTER — Other Ambulatory Visit: Payer: Self-pay

## 2019-10-19 ENCOUNTER — Ambulatory Visit (INDEPENDENT_AMBULATORY_CARE_PROVIDER_SITE_OTHER): Payer: Managed Care, Other (non HMO) | Admitting: Podiatry

## 2019-10-19 ENCOUNTER — Encounter: Payer: Self-pay | Admitting: Podiatry

## 2019-10-19 DIAGNOSIS — B351 Tinea unguium: Secondary | ICD-10-CM

## 2019-10-19 DIAGNOSIS — Z79899 Other long term (current) drug therapy: Secondary | ICD-10-CM

## 2019-10-19 NOTE — Patient Instructions (Signed)
Itraconazole capsules and tablets °What is this medicine? °ITRACONAZOLE (i tra KO na zole) is an antifungal medicine. It is used to treat certain kinds of fungal or yeast infections. °This medicine may be used for other purposes; ask your health care provider or pharmacist if you have questions. °COMMON BRAND NAME(S): ONMEL, Sporanox, TOLSURA °What should I tell my health care provider before I take this medicine? °They need to know if you have any of these conditions: °· heart disease °· history of irregular heartbeat °· immune system problems °· kidney disease °· liver disease °· lung or breathing disease °· an unusual or allergic reaction to itraconazole, or other antifungal medicines, foods, dyes or preservatives °· pregnant or trying to get pregnant °· breast-feeding °How should I use this medicine? °Take this medicine by mouth with a full glass of water. Follow the directions on the prescription label. Take this medicine with food. Avoid taking antacids, H2-blockers, or proton pump inhibitors within 2 hours of taking this medicine. It is best to separate these medicines by 2 hours. Take your medicine at regular intervals. Do not take your medicine more often than directed. Take all of your medicine as directed even if you think you are better. Do not skip doses or stop your medicine early. °Talk to your pediatrician regarding the use of this medicine in children. Special care may be needed. °Overdosage: If you think you have taken too much of this medicine contact a poison control center or emergency room at once. °NOTE: This medicine is only for you. Do not share this medicine with others. °What if I miss a dose? °If you miss a dose, take it as soon as you can. If it is almost time for your next dose, take only that dose. Do not take double or extra doses. °What may interact with this medicine? °Do not take this medicine with any of the following medications: °· alfuzosin °· alprazolam °· avanafil °· certain  medicines for blood pressure like felodipine, nisoldipine °· certain medicines for cholesterol like cerivastatin, lovastatin, simvastatin, lomitapide °· certain medicines for the heart like disopyramide, dofetilide, dronedarone, eplerenone, ivabradine, quinidine, ranolazine °· cisapride °· colchicine (if you have liver or kidney problems) °· conivaptan °· ergot alkaloids like dihydroergotamine, ergonovine, ergotamine, methylergonovine °· irinotecan °· isavuconazole °· lurasidone °· methadone °· midazolam °· naloxegol °· nevirapine °· other medicines that prolong the QT interval (cause an abnormal heart rhythm) °· pimozide °· red yeast rice °· sirolimus °· thioridazine °· ticagrelor °· triazolam °This medicine may also interact with the following medications: °· aliskiren °· amlodipine °· antacids °· antiviral medicines for HIV or AIDS °· aprepitant °· atorvastatin °· buprenorphine °· certain antibiotics like ciprofloxacin, clarithromycin, erythromycin °· certain medicines for bladder problems like fesoterodine, solifenacin, tolterodine °· certain medicines for cancer like axitinib, bortezomib, busulfan, dabrafenib, dasatinib, docetaxel, erlotinib, gefitinib, ibrutinib, imatinib, ixabepilone, lapatinib, nilotinib, ponatinib, sunitinib, trabectedin, trimetrexate, vinca alkaloids °· certain medicines for depression, anxiety, or psychotic disturbances like aripiprazole, buspirone, diazepam, haloperidol, perospirone, quetiapine, risperidone °· certain medicines for erectile dysfunction like vardenafil, sildenafil, tadalafil °· certain medicines for pain like alfentanil, fentanyl, oxycodone, sufentanil °· certain medicines for stomach problems like cimetidine, famotidine, omeprazole, lansoprazole °· certain medicines that treat or prevent blood clots like warfarin, dabigatran, rivaroxaban °· certain medicines for seizures like carbamazepine, phenytoin °· certain medicines for tuberculosis like isoniazid, INH, rifabutin,  rifampin, rifapentine °· cilostazol °· cinacalcet °· cyclosporine °· digoxin °· eletriptan °· everolimus °· halofantrine °· isradipine °· meloxicam °· nadolol °·   nifedipine °· other medicines for fungal infections °· praziquantel °· ramelteon °· repaglinide °· salmeterol °· saxagliptin °· steroid medicines like budesonide, ciclesonide, dexamethasone, fluticasone, methylprednisolone °· tacrolimus °· tamsulosin °· tolvaptan °· verapamil °· ziprasidone °This list may not describe all possible interactions. Give your health care provider a list of all the medicines, herbs, non-prescription drugs, or dietary supplements you use. Also tell them if you smoke, drink alcohol, or use illegal drugs. Some items may interact with your medicine. °What should I watch for while using this medicine? °Tell your doctor or healthcare professional if your symptoms do not start to get better or if they get worse. °You may get drowsy or dizzy. Do not drive, use machinery, or do anything that needs mental alertness until you know how the medicine affects you. Do not stand or sit up quickly, especially if you are an older patient. This reduces the risk of dizzy or fainting spells. °What side effects may I notice from receiving this medicine? °Side effects that you should report to your doctor or health care professional as soon as possible: °· allergic reactions like skin rash, itching or hives, swelling of the face, lips, or tongue °· changes in hearing °· pain, tingling, numbness in the hands or feet °· signs and symptoms of heart failure like breathing problems; fast, irregular heartbeat; sudden weight gain; swelling of the ankles, feet; unusually weak or tired °· signs and symptoms of liver injury like dark yellow or brown urine; general ill feeling or flu-like symptoms; light-colored stools; loss of appetite; right upper belly pain; yellowing of the eyes or skin °Side effects that usually do not require medical attention (report to  your doctor or health care professional if they continue or are bothersome): °· blurred vision °· diarrhea °· dizziness °· headache °· nausea, vomiting °This list may not describe all possible side effects. Call your doctor for medical advice about side effects. You may report side effects to FDA at 1-800-FDA-1088. °Where should I keep my medicine? °Keep out of the reach of children. °Store at room temperature between 15 and 25 degrees C (59 and 77 degrees F). Protect from light and moisture. Throw away any unused medicine after the expiration date. °NOTE: This sheet is a summary. It may not cover all possible information. If you have questions about this medicine, talk to your doctor, pharmacist, or health care provider. °© 2020 [employer] (2018-02-09 13:38:38) ° ° °

## 2019-10-20 LAB — CBC WITH DIFFERENTIAL/PLATELET
Absolute Monocytes: 409 cells/uL (ref 200–950)
Basophils Absolute: 51 cells/uL (ref 0–200)
Basophils Relative: 1.1 %
Eosinophils Absolute: 409 cells/uL (ref 15–500)
Eosinophils Relative: 8.9 %
HCT: 45.9 % (ref 38.5–50.0)
Hemoglobin: 15.7 g/dL (ref 13.2–17.1)
Lymphs Abs: 1394 cells/uL (ref 850–3900)
MCH: 30 pg (ref 27.0–33.0)
MCHC: 34.2 g/dL (ref 32.0–36.0)
MCV: 87.8 fL (ref 80.0–100.0)
MPV: 11.3 fL (ref 7.5–12.5)
Monocytes Relative: 8.9 %
Neutro Abs: 2337 cells/uL (ref 1500–7800)
Neutrophils Relative %: 50.8 %
Platelets: 204 10*3/uL (ref 140–400)
RBC: 5.23 10*6/uL (ref 4.20–5.80)
RDW: 12.3 % (ref 11.0–15.0)
Total Lymphocyte: 30.3 %
WBC: 4.6 10*3/uL (ref 3.8–10.8)

## 2019-10-20 LAB — HEPATIC FUNCTION PANEL
AG Ratio: 1.6 (calc) (ref 1.0–2.5)
ALT: 13 U/L (ref 9–46)
AST: 14 U/L (ref 10–40)
Albumin: 4.6 g/dL (ref 3.6–5.1)
Alkaline phosphatase (APISO): 32 U/L — ABNORMAL LOW (ref 36–130)
Bilirubin, Direct: 0.2 mg/dL (ref 0.0–0.2)
Globulin: 2.8 g/dL (calc) (ref 1.9–3.7)
Indirect Bilirubin: 1.2 mg/dL (calc) (ref 0.2–1.2)
Total Bilirubin: 1.4 mg/dL — ABNORMAL HIGH (ref 0.2–1.2)
Total Protein: 7.4 g/dL (ref 6.1–8.1)

## 2019-10-22 ENCOUNTER — Other Ambulatory Visit: Payer: Self-pay | Admitting: Podiatry

## 2019-10-22 MED ORDER — FLUCONAZOLE 150 MG PO TABS: 150.0000 mg | ORAL_TABLET | ORAL | 0 refills | Status: DC

## 2019-10-25 NOTE — Progress Notes (Signed)
Subjective:   Patient ID: Bryan Ruiz, male   DOB: 44 y.o.   MRN: [id number]   HPI 44 year old male presents the office for concerns of toenail fungus.  He states he previously was on Lamisil which made him sick to his stomach.  His nails are thick and discolored.  Both Lamisil did help previously but did not make it completely go away and it came back.  No pain in the nails currently denies any redness or drainage or any swelling.   Review of Systems  All other systems reviewed and are negative.  Past Medical History:  Diagnosis Date  . History of tinea cruris   . History of vertigo 2009   once prior as of 11/2014   . Onychomycosis     Past Surgical History:  Procedure Laterality Date  . VASECTOMY       Current Outpatient Medications:  .  fluconazole (DIFLUCAN) 150 MG tablet, Take 1 tablet (150 mg total) by mouth once a week., Disp: 12 tablet, Rfl: 0 .  triamcinolone cream (KENALOG) 0.1 %, Apply 1 application topically 2 (two) times daily., Disp: 30 g, Rfl: 0  No Known Allergies       Objective:  Physical Exam  General: AAO x3, NAD  Dermatological: Nails appear to be hypertrophic, dystrophic with yellow-brown discoloration.  There is no extension of any hyperpigmentation of the surrounding skin.  No pain of the nails there is no redness or drainage or any signs of infection.  No open lesions.  Vascular: Dorsalis Pedis artery and Posterior Tibial artery pedal pulses are 2/4 bilateral with immedate capillary fill time. There is no pain with calf compression, swelling, warmth, erythema.   Neruologic: Grossly intact via light touch bilateral.   Musculoskeletal: No gross boney pedal deformities bilateral. No pain, crepitus, or limitation noted with foot and ankle range of motion bilateral. Muscular strength 5/5 in all groups tested bilateral.  Gait: Unassisted, Nonantalgic.      Assessment:   Onychomycosis    Plan:  -Treatment options discussed including all  alternatives, risks, and complications -Etiology of symptoms were discussed -Discussed other treatment options aside from Lamisil.  We discussed itraconazole, fluconazole or topical treatment and also discussed laser therapy.  He wants to proceed with likely itraconazole.  Will order CBC and LFT.  Discussed side effects of medications.  Discussed overall foot hygiene as well.  Return in about 6 weeks (around 11/30/2019).  Trula Slade DPM

## 2019-12-06 ENCOUNTER — Encounter: Payer: Self-pay | Admitting: Medical

## 2019-12-06 ENCOUNTER — Other Ambulatory Visit: Payer: Self-pay

## 2019-12-06 ENCOUNTER — Ambulatory Visit (INDEPENDENT_AMBULATORY_CARE_PROVIDER_SITE_OTHER): Payer: Self-pay | Admitting: Medical

## 2019-12-06 VITALS — BP 110/70 | HR 90 | Ht 69.0 in | Wt 136.8 lb

## 2019-12-06 DIAGNOSIS — Z0289 Encounter for other administrative examinations: Secondary | ICD-10-CM

## 2019-12-06 DIAGNOSIS — Z973 Presence of spectacles and contact lenses: Secondary | ICD-10-CM

## 2019-12-06 LAB — POCT URINALYSIS DIP (PROADVANTAGE DEVICE)
Bilirubin, UA: NEGATIVE
Blood, UA: NEGATIVE
Glucose, UA: NEGATIVE mg/dL
Ketones, POC UA: NEGATIVE mg/dL
Leukocytes, UA: NEGATIVE
Nitrite, UA: NEGATIVE
Protein Ur, POC: NEGATIVE mg/dL
Specific Gravity, Urine: 1.03
Urobilinogen, Ur: NEGATIVE
pH, UA: 6 (ref 5.0–8.0)

## 2019-12-06 NOTE — Progress Notes (Signed)
   Commercial Driver Medical Examination   Bryan Ruiz is a 44 y.o. male who presents today for a commercial driver fitness determination physical exam.  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  Medical care team includes:  Girtha Rm, NP-C here for- primary care  The patient reports-no problems  Review of Systems A comprehensive review of systems was reviewed and noted as below:  Eye: - corrective lenses, -lasik surgery or other eye surgery, -glaucoma, -cataracts, -macular degeneration, -monocular vision, -medication for eye condition, -blurred vision,   Ears: -hearing problems, - hearing aids, -ear pain, -ear drainage, -ear fullness, -tinnitus, -recurrent ear infection, -previous ear surgery, - vertigo, -menieres disease  Endocrine: -polydipsia, -polyuria, -weight loss, -fainting, -dizziness, - altered or loss of consciousness, -hypoglycemia  Cardiovascular: -heart disease, -CHF, -heart attack, -cardiac stents, -bypass surgery, -other heart surgery, -hypertension, -blood clots, -pacemaker, -medications for heart condition, -chest pain, -SOB, -palpitations, -fainting, -dizziness, -dyspnea  Respiratory: -asthma, -COPD, other lung disease, -smoker, -chest tightness, - wheezing, -snoring, -daytime sleepiness, -sleep apnea or uses CPAP, -narcolepsy, -sleeping disorder  Allergy: -uncontrollable sneezing or allergy symptoms  Musculoskeletal: -missing body parts, -muscle disease, -bone disease, -spine injury, -low back pain, -medication for joints, bones, muscles or pain, -physical limitations, -joint pain, -neck pain, -limitations of neck ROM, -back surgery, orthopedic surgery, -rheumatologic condition, -gout  Neurologic: -neurologic disease, -dementia, -seizures, -parkinsons, -tremor, -memory problems, -weakness, -numbness, -tingling, -medication for neurologic condition, -medications for sleep condition  Gastric: -abdominal pain, -chronic  diarrhea or IBS, -uncontrollable nausea  Kidney/Renal: -hematuria, -dialysis, kidney disease, polycystic kidney disease  Psychiatric: -homicidal thoughts, -suicidal thoughts, -prior suicide attempts, -get into fights/hurting others, -memory or concentration problems, -delusions, -hallucinations, -hospitalizaiton for mental health problem, -depression, -anxiety, -bipolar  Drug use: -None     Reviewed their medical, surgical, family, social, medication, and allergy history and updated chart as appropriate.        Objective:   Physical Exam  BP 110/70   Pulse 90   Ht 5\' 9"  (1.753 m)   Wt 136 lb 12.8 oz (62.1 kg)   SpO2 95%   BMI 20.20 kg/m   General appearance: alert, no distress, WD/WN, lean African-American male Skin: No worrisome lesions HEENT: normocephalic, conjunctiva/corneas normal, sclerae anicteric, PERRLA, EOMi, nares patent, no discharge or erythema, pharynx normal Oral cavity: MMM, tongue normal, teeth normal Neck: supple, no lymphadenopathy, no thyromegaly, no masses, normal ROM Chest: non tender, normal shape and expansion Heart: RRR, normal S1, S2, no murmurs Lungs: CTA bilaterally, no wheezes, rhonchi, or rales Abdomen: +bs, soft, non tender, non distended, no masses, no hepatomegaly, no splenomegaly, no bruits Back: non tender, normal ROM, no scoliosis Musculoskeletal: upper extremities non tender, no obvious deformity, normal ROM throughout, lower extremities non tender, no obvious deformity, normal ROM throughout Extremities: no edema, no cyanosis, no clubbing Pulses: 2+ symmetric, upper and lower extremities, normal cap refill Neurological: alert, oriented x 3, CN2-12 intact, strength normal upper extremities and lower extremities, sensation normal throughout, DTRs 2+ throughout, no cerebellar signs, gait normal Psychiatric: normal affect, behavior normal, pleasant  GU: normal male external genitalia, nontender, no masses, no hernia, no  lymphadenopathy Rectal: Deferred       Assessment:   Encounter Diagnoses  Name Primary?  . Health examination of defined subpopulation Yes  . Wears glasses      Plan:    Healthy, clear for 2-year certificate

## 2019-12-06 NOTE — Addendum Note (Signed)
Addended by: Edgar Frisk on: 12/06/2019 02:59 PM   Modules accepted: Orders

## 2019-12-06 NOTE — Patient Instructions (Signed)
I have completed my portions of the updated DOT certificate and forms.    Please mail the "Medical Certificate" page to [email or web address]  [place] [address] [facility] [place] [address] [place], [place] [address]??  [email or web address]  I will upload data to the Engineer, manufacturing.

## 2020-01-05 ENCOUNTER — Telehealth: Payer: Self-pay | Admitting: Podiatry

## 2020-01-05 ENCOUNTER — Other Ambulatory Visit: Payer: Self-pay | Admitting: Podiatry

## 2020-01-05 MED ORDER — FLUCONAZOLE 150 MG PO TABS: 150.0000 mg | ORAL_TABLET | ORAL | 0 refills | Status: DC

## 2020-01-05 NOTE — Telephone Encounter (Signed)
Done  Please schedule him a 3 month appt to check the nails. Thanks!

## 2020-01-05 NOTE — Telephone Encounter (Signed)
Patient would like a refill of his nail fungus medication.  Pharmacy is the [facility] in [place].

## 2020-01-06 ENCOUNTER — Encounter: Payer: Self-pay | Admitting: Family Medicine

## 2020-01-06 ENCOUNTER — Ambulatory Visit: Payer: Managed Care, Other (non HMO) | Admitting: Family Medicine

## 2020-01-06 VITALS — BP 110/70 | HR 72 | Temp 98.3°F | Wt 136.2 lb

## 2020-01-06 DIAGNOSIS — M79642 Pain in left hand: Secondary | ICD-10-CM

## 2020-01-06 DIAGNOSIS — M65332 Trigger finger, left middle finger: Secondary | ICD-10-CM

## 2020-01-06 DIAGNOSIS — M79641 Pain in right hand: Secondary | ICD-10-CM | POA: Diagnosis not present

## 2020-01-06 MED ORDER — IBUPROFEN 800 MG PO TABS: 800.0000 mg | ORAL_TABLET | Freq: Three times a day (TID) | ORAL | 0 refills | Status: DC | PRN

## 2020-01-06 NOTE — Progress Notes (Signed)
   Subjective:    Patient ID: Bryan Ruiz, male    DOB: 05-01-1975, 44 y.o.   MRN: [id number]  HPI Chief Complaint  Patient presents with  . pain    hand pain that radiates  up to elbow on both hands. seen ortho last year and was given shot in wrist   Complains of a several week history of bilateral middle finger pain that shoots up his forearms to the ulnar regions.  He works as a Dealer. Reports his middle finger on his left hand gets stuck and he has to move it on his own. Tingling in hands at times.   Sleeps with his forearms bent and wakes up with pain when he straightens out his arms.  Weakness with certain hand movements and lifting.   No other arthralgias or myalgias Denies fever, chills, nausea, vomiting   Review of Systems Pertinent positives and negatives in the history of present illness.     Objective:   Physical Exam BP 110/70   Pulse 72   Temp 98.3 F (36.8 C)   Wt 136 lb 3.2 oz (61.8 kg)   BMI 20.11 kg/m   Wrists and hands with normal sensation, ROM and strength. TTP over proximal posterior forearm. Pain in forearm with supination.       Assessment & Plan:  Bilateral hand pain - Plan: Ambulatory referral to Hand Surgery, ibuprofen (ADVIL) 800 MG tablet  Trigger middle finger of left hand - Plan: Ambulatory referral to Hand Surgery  Suspicious for cubital tunnel syndrome Conservative treatment recommended. Ibuprofen prescribed. Try ice, topical analgesic and splinting his elbows at night to avoid bending.  Referral to hand specialist

## 2020-01-06 NOTE — Patient Instructions (Signed)
Avoid boxing or any strenuous activities with your hands or wrist  Try to use a homemade splint to keep your elbow straight at night as we discussed.  Try over-the-counter topical pain medicine such as Voltaren gel  You can use ice as well  Try ibuprofen 800 mg as needed for pain.  You will receive a call from the hand specialist to schedule a visit.

## 2020-01-13 ENCOUNTER — Encounter: Payer: Self-pay | Admitting: Neurology

## 2020-01-13 ENCOUNTER — Other Ambulatory Visit: Payer: Self-pay

## 2020-01-13 DIAGNOSIS — R202 Paresthesia of skin: Secondary | ICD-10-CM

## 2020-01-13 NOTE — Progress Notes (Signed)
emg 

## 2020-02-29 ENCOUNTER — Other Ambulatory Visit: Payer: Self-pay

## 2020-02-29 ENCOUNTER — Encounter: Payer: Self-pay | Admitting: Neurology

## 2020-02-29 ENCOUNTER — Ambulatory Visit (INDEPENDENT_AMBULATORY_CARE_PROVIDER_SITE_OTHER): Payer: 59 | Admitting: Neurology

## 2020-02-29 DIAGNOSIS — R202 Paresthesia of skin: Secondary | ICD-10-CM | POA: Diagnosis not present

## 2020-02-29 NOTE — Procedures (Signed)
[facility]  [address] [place], [address]  [place], [place] [address] Tel: [phone] Fax:  [phone] Test Date:  02/29/2020  Patient: Bryan Ruiz DOB: March 20, 1975 Physician: Nita Sickle, DO  Sex: Male Height: 5\' 9"  Ref Phys:  ID#: Monica Martinez   Technician:    Patient Complaints: This is a 44 year old man referred for evaluation of bilateral hand pain and paresthesias.  NCV & EMG Findings: Extensive electrodiagnostic testing of the right upper extremity and additional studies of the left shows: 1. Bilateral median, ulnar, and mixed palmar sensory responses are within normal limits. 2. Bilateral median and ulnar motor responses are within normal limits. 3. There is no evidence of active or chronic motor axonal loss changes affecting any of the tested muscles.  Motor unit configuration and recruitment pattern is within normal limits.  Impression: This is a normal study of the upper extremities.  In particular, there is no evidence of carpal tunnel syndrome or a cervical radiculopathy.     ___________________________ 47, DO    Nerve Conduction Studies Anti Sensory Summary Table   Stim Site NR Peak (ms) Norm Peak (ms) P-T Amp (V) Norm P-T Amp  Left Median Anti Sensory (2nd Digit)  34C  Wrist    3.0 <3.4 31.0 >20  Right Median Anti Sensory (2nd Digit)  34C  Wrist    2.8 <3.4 30.9 >20  Left Ulnar Anti Sensory (5th Digit)  34C  Wrist    2.8 <3.1 22.4 >12  Right Ulnar Anti Sensory (5th Digit)  34C  Wrist    2.9 <3.1 22.1 >12   Motor Summary Table   Stim Site NR Onset (ms) Norm Onset (ms) O-P Amp (mV) Norm O-P Amp Site1 Site2 Delta-0 (ms) Dist (cm) Vel (m/s) Norm Vel (m/s)  Left Median Motor (Abd Poll Brev)  34C  Wrist    2.9 <3.9 10.4 >6 Elbow Wrist 5.1 30.0 59 >50  Elbow    8.0  9.3         Right Median Motor (Abd Poll Brev)  34C  Wrist    2.9 <3.9 10.8 >6 Elbow Wrist 5.0 30.0 60 >50  Elbow    7.9  10.2         Left Ulnar  Motor (Abd Dig Minimi)  34C  Wrist    2.2 <3.1 8.5 >7 B Elbow Wrist 3.9 23.0 59 >50  B Elbow    6.1  8.3  A Elbow B Elbow 1.9 10.0 53 >50  A Elbow    8.0  8.3         Right Ulnar Motor (Abd Dig Minimi)  34C  Wrist    2.1 <3.1 8.6 >7 B Elbow Wrist 4.0 25.0 63 >50  B Elbow    6.1  8.0  A Elbow B Elbow 1.6 10.0 62 >50  A Elbow    7.7  7.7          Comparison Summary Table   Stim Site NR Peak (ms) Norm Peak (ms) P-T Amp (V) Site1 Site2 Delta-P (ms) Norm Delta (ms)  Left Median/Ulnar Palm Comparison (Wrist - 8cm)  34C  Median Palm    1.5 <2.2 71.5 Median Palm Ulnar Palm 0.0   Ulnar Palm    1.5 <2.2 17.1      Right Median/Ulnar Palm Comparison (Wrist - 8cm)  34C  Median Palm    1.5 <2.2 84.6 Median Palm Ulnar Palm 0.2   Ulnar Palm    1.7 <2.2  29.9       EMG   Side Muscle Ins Act Fibs Psw Fasc Number Recrt Dur Dur. Amp Amp. Poly Poly. Comment  Right 1stDorInt Nml Nml Nml Nml Nml Nml Nml Nml Nml Nml Nml Nml N/A  Right PronatorTeres Nml Nml Nml Nml Nml Nml Nml Nml Nml Nml Nml Nml N/A  Right Biceps Nml Nml Nml Nml Nml Nml Nml Nml Nml Nml Nml Nml N/A  Right Triceps Nml Nml Nml Nml Nml Nml Nml Nml Nml Nml Nml Nml N/A  Right Deltoid Nml Nml Nml Nml Nml Nml Nml Nml Nml Nml Nml Nml N/A  Left 1stDorInt Nml Nml Nml Nml Nml Nml Nml Nml Nml Nml Nml Nml N/A  Left PronatorTeres Nml Nml Nml Nml Nml Nml Nml Nml Nml Nml Nml Nml N/A  Left Biceps Nml Nml Nml Nml Nml Nml Nml Nml Nml Nml Nml Nml N/A  Left Triceps Nml Nml Nml Nml Nml Nml Nml Nml Nml Nml Nml Nml N/A  Left Deltoid Nml Nml Nml Nml Nml Nml Nml Nml Nml Nml Nml Nml N/A      Waveforms:

## 2020-04-06 ENCOUNTER — Other Ambulatory Visit: Payer: Self-pay

## 2020-04-06 ENCOUNTER — Encounter: Payer: Self-pay | Admitting: Podiatry

## 2020-04-06 ENCOUNTER — Ambulatory Visit (INDEPENDENT_AMBULATORY_CARE_PROVIDER_SITE_OTHER): Payer: PRIVATE HEALTH INSURANCE | Admitting: Podiatry

## 2020-04-06 DIAGNOSIS — Z79899 Other long term (current) drug therapy: Secondary | ICD-10-CM

## 2020-04-06 MED ORDER — FLUCONAZOLE 150 MG PO TABS: 150.0000 mg | ORAL_TABLET | ORAL | 0 refills | Status: DC

## 2020-04-09 NOTE — Progress Notes (Signed)
Subjective: 45 year old male presents the office today for follow-up evaluation of toenail fungus.  Is been on fluconazole now for about 6 months and he states that this is doing much better than the Lamisil.  He denies any pain to the nails and denies any redness or drainage or any swelling.  He is having no side effects the medication. Denies any systemic complaints such as fevers, chills, nausea, vomiting. No acute changes since last appointment, and no other complaints at this time.   Objective: AAO x3, NAD DP/PT pulses palpable bilaterally, CRT less than 3 seconds Over the nails are doing much better.  There is clear on the proximal half of the nails at the distal aspect still fungus with the nails are growing out.  There is no pain in the nails there is no redness or drainage or any signs of infection. No pain with calf compression, swelling, warmth, erythema  Assessment: 45 year old male onychomycosis, currently on fluconazole  Plan: -All treatment options discussed with the patient including all alternatives, risks, complications.  -Already continue fluconazole for additional 3 months was reordered.  Recheck blood work.  CBC and LFT order provided.  -Patient encouraged to call the office with any questions, concerns, change in symptoms.   Trula Slade DPM

## 2020-04-10 ENCOUNTER — Telehealth: Payer: Self-pay | Admitting: Podiatry

## 2020-04-10 ENCOUNTER — Telehealth: Payer: Self-pay | Admitting: *Deleted

## 2020-04-10 NOTE — Telephone Encounter (Signed)
Called and spoke with Bryan Ruiz from the pharmacy number on the patient's card [phone] and the representative stated that the medicine fluconazole did not need a prior authorization and patient would need to pay a $20.00 copay and I called the pharmacy to let them know and the pharmacist stated that it still needed a prior authorization but she did a single care and it was $11.85 for four tablets and I called the patient and he agreed to just go and pay the $11.85 for the four tablets. [name]

## 2020-06-28 NOTE — Progress Notes (Signed)
Subjective:    Patient ID: Bryan Ruiz, male    DOB: Nov 02, 1975, 45 y.o.   MRN: [id number]  HPI Chief Complaint  Patient presents with  . fasting cpe    Fasting cpe, no other concerns   He is here for a complete physical exam.  Father with prostate cancer and he would like to start screening.   His onychomycosis cleared up.   Social history: Lives with his male partner, 5 kids, works as Merchant navy officer at [employer]  Denies smoking, drinking alcohol, drug use Diet: fairly unhealthy and only eating 2 meals per day. States he had a doughnut this morning. Usually skips lunch but bought Ensure recently.  Exercise: none outside of work   Immunizations: states he did get all 3 Covid vaccines   Health maintenance:  Colonoscopy: never  Last PSA: never  Last Dental Exam: 3 weeks ago  Last Eye Exam: last month   Wears seatbelt always, smoke detectors in home and functioning, does not text while driving, feels safe in home environment.  Reviewed allergies, medications, past medical, surgical, family, and social history.   Review of Systems Review of Systems Constitutional: -fever, -chills, -sweats, -unexpected weight change,-fatigue ENT: -runny nose, -ear pain, -sore throat Cardiology:  -chest pain, -palpitations, -edema Respiratory: -cough, -shortness of breath, -wheezing Gastroenterology: -abdominal pain, -nausea, -vomiting, -diarrhea, -constipation  Hematology: -bleeding or bruising problems Musculoskeletal: -arthralgias, -myalgias, -joint swelling, -back pain Ophthalmology: -vision changes Urology: -dysuria, -difficulty urinating, -hematuria, -urinary frequency, -urgency Neurology: -headache, -weakness, -tingling, -numbness       Objective:   Physical Exam BP 110/68   Pulse 94   Ht 5\' 10"  (1.778 m)   Wt 141 lb 12.8 oz (64.3 kg)   BMI 20.35 kg/m   General Appearance:    Alert, cooperative, no distress, appears stated age  Head:    Normocephalic, without obvious  abnormality, atraumatic  Eyes:    PERRL, conjunctiva/corneas clear, EOM's intact  Ears:    Normal TM's and external ear canals  Nose:   Mask on   Throat:   Mask on   Neck:   Supple, no lymphadenopathy;  thyroid:  no   enlargement/tenderness/nodules; no JVD  Back:    Spine nontender, no curvature, ROM normal, no CVA     tenderness  Lungs:     Clear to auscultation bilaterally without wheezes, rales or     ronchi; respirations unlabored  Chest Wall:    No tenderness or deformity   Heart:    Regular rate and rhythm, S1 and S2 normal, no murmur, rub   or gallop  Breast Exam:    No chest wall tenderness, masses or gynecomastia  Abdomen:     Soft, non-tender, nondistended, normoactive bowel sounds,    no masses, no hepatosplenomegaly  Genitalia:    Declines      Extremities:   No clubbing, cyanosis or edema  Pulses:   2+ and symmetric all extremities  Skin:   Skin color, texture, turgor normal, no rashes or lesions  Lymph nodes:   Cervical, supraclavicular, and axillary nodes normal  Neurologic:   CNII-XII intact, normal strength, sensation and gait          Psych:   Normal mood, affect, hygiene and grooming.         Assessment & Plan:  Routine general medical examination at a health care facility - Plan: CBC with Differential/Platelet, Comprehensive metabolic panel, Lipid panel -Preventive health care reviewed.  He is up-to-date on dental and eye  exams.  Counseling on healthy lifestyle including diet and exercise.  Recommend he start trying to eat either a protein bar or have a shake during lunch instead of skipping it.  Immunizations reviewed.  He did have his COVID booster but does not have the date he received it.  Recommend regular self testicular exams.  Discussed safety  Need for hepatitis C screening test - Plan: Hepatitis C antibody -Done per screening guidelines  Family history of prostate cancer in father - Plan: PSA -He would like to start prostate cancer screening due to his  father having prostate cancer.  Screening for lipid disorders - Plan: Lipid panel  Screening for prostate cancer - Plan: PSA -He is aware that there is an increased risk since his father does have prostate cancer.

## 2020-06-28 NOTE — Patient Instructions (Signed)

## 2020-06-29 ENCOUNTER — Encounter: Payer: Managed Care, Other (non HMO) | Admitting: Family Medicine

## 2020-06-29 ENCOUNTER — Encounter: Payer: Self-pay | Admitting: Family Medicine

## 2020-06-29 ENCOUNTER — Other Ambulatory Visit: Payer: Self-pay

## 2020-06-29 ENCOUNTER — Ambulatory Visit: Payer: PRIVATE HEALTH INSURANCE | Admitting: Family Medicine

## 2020-06-29 VITALS — BP 110/68 | HR 94 | Ht 70.0 in | Wt 141.8 lb

## 2020-06-29 DIAGNOSIS — Z0000 Encounter for general adult medical examination without abnormal findings: Secondary | ICD-10-CM

## 2020-06-29 DIAGNOSIS — Z1159 Encounter for screening for other viral diseases: Secondary | ICD-10-CM | POA: Diagnosis not present

## 2020-06-29 DIAGNOSIS — Z8042 Family history of malignant neoplasm of prostate: Secondary | ICD-10-CM

## 2020-06-29 DIAGNOSIS — Z13220 Encounter for screening for lipoid disorders: Secondary | ICD-10-CM

## 2020-06-29 DIAGNOSIS — Z125 Encounter for screening for malignant neoplasm of prostate: Secondary | ICD-10-CM

## 2020-06-30 LAB — COMPREHENSIVE METABOLIC PANEL
ALT: 16 IU/L (ref 0–44)
AST: 15 IU/L (ref 0–40)
Albumin/Globulin Ratio: 1.7 (ref 1.2–2.2)
Albumin: 4.7 g/dL (ref 4.0–5.0)
Alkaline Phosphatase: 32 IU/L — ABNORMAL LOW (ref 44–121)
BUN/Creatinine Ratio: 12 (ref 9–20)
BUN: 14 mg/dL (ref 6–24)
Bilirubin Total: 1.6 mg/dL — ABNORMAL HIGH (ref 0.0–1.2)
CO2: 25 mmol/L (ref 20–29)
Calcium: 9.4 mg/dL (ref 8.7–10.2)
Chloride: 101 mmol/L (ref 96–106)
Creatinine, Ser: 1.14 mg/dL (ref 0.76–1.27)
Globulin, Total: 2.8 g/dL (ref 1.5–4.5)
Glucose: 71 mg/dL (ref 65–99)
Potassium: 4.3 mmol/L (ref 3.5–5.2)
Sodium: 140 mmol/L (ref 134–144)
Total Protein: 7.5 g/dL (ref 6.0–8.5)
eGFR: 82 mL/min/{1.73_m2} (ref >59)

## 2020-06-30 LAB — CBC WITH DIFFERENTIAL/PLATELET
Basophils Absolute: 0 10*3/uL (ref 0.0–0.2)
Basos: 1 %
EOS (ABSOLUTE): 0.3 10*3/uL (ref 0.0–0.4)
Eos: 8 %
Hematocrit: 45 % (ref 37.5–51.0)
Hemoglobin: 15.6 g/dL (ref 13.0–17.7)
Immature Grans (Abs): 0 10*3/uL (ref 0.0–0.1)
Immature Granulocytes: 0 %
Lymphocytes Absolute: 1.2 10*3/uL (ref 0.7–3.1)
Lymphs: 37 %
MCH: 30.1 pg (ref 26.6–33.0)
MCHC: 34.7 g/dL (ref 31.5–35.7)
MCV: 87 fL (ref 79–97)
Monocytes Absolute: 0.3 10*3/uL (ref 0.1–0.9)
Monocytes: 10 %
Neutrophils Absolute: 1.4 10*3/uL (ref 1.4–7.0)
Neutrophils: 44 %
Platelets: 193 10*3/uL (ref 150–450)
RBC: 5.18 x10E6/uL (ref 4.14–5.80)
RDW: 12 % (ref 11.6–15.4)
WBC: 3.2 10*3/uL — ABNORMAL LOW (ref 3.4–10.8)

## 2020-06-30 LAB — HEPATITIS C ANTIBODY: Hep C Virus Ab: <0.1 s/co ratio (ref 0.0–0.9)

## 2020-06-30 LAB — LIPID PANEL
Chol/HDL Ratio: 3.3 ratio (ref 0.0–5.0)
Cholesterol, Total: 162 mg/dL (ref 100–199)
HDL: 49 mg/dL (ref >39)
LDL Chol Calc (NIH): 103 mg/dL — ABNORMAL HIGH (ref 0–99)
Triglycerides: 47 mg/dL (ref 0–149)
VLDL Cholesterol Cal: 10 mg/dL (ref 5–40)

## 2020-06-30 LAB — PSA: Prostate Specific Ag, Serum: 1 ng/mL (ref 0.0–4.0)

## 2020-07-02 NOTE — Progress Notes (Signed)
Please ask maria to add a direct and indirect bilirubin or fractionated bilirubin due to elevated serum bilirubin.

## 2020-07-04 LAB — BILIRUBIN, FRACTIONATED(TOT/DIR/INDIR)
Bilirubin Total: 1.4 mg/dL — ABNORMAL HIGH (ref 0.0–1.2)
Bilirubin, Direct: 0.28 mg/dL (ref 0.00–0.40)
Bilirubin, Indirect: 1.12 mg/dL — ABNORMAL HIGH (ref 0.10–0.80)

## 2020-07-04 LAB — SPECIMEN STATUS REPORT

## 2020-10-18 ENCOUNTER — Other Ambulatory Visit: Payer: Self-pay | Admitting: Podiatry

## 2020-10-19 NOTE — Telephone Encounter (Signed)
Please advise 

## 2020-10-25 ENCOUNTER — Telehealth: Payer: Self-pay

## 2020-10-25 NOTE — Telephone Encounter (Signed)
Pt. Called stating he wanted to know if he could get a refill on his terbinafine for his toe fungus. He try's to stay on top of it so it doesn't get really bad. He went to pick it up today but he was out of refills on it. He can see a little bit of fungus starting on his toe's and doesn't want it to get out of control. He uses [facility] in [place].

## 2020-10-26 NOTE — Telephone Encounter (Signed)
I called pt. To schedule f/u on toe fungus he said he would call back on Monday to schedule an appointment with Vickie next week.

## 2020-11-01 ENCOUNTER — Other Ambulatory Visit: Payer: Self-pay | Admitting: Podiatry

## 2020-11-01 ENCOUNTER — Telehealth: Payer: Self-pay | Admitting: *Deleted

## 2020-11-01 DIAGNOSIS — Z79899 Other long term (current) drug therapy: Secondary | ICD-10-CM

## 2020-11-01 NOTE — Telephone Encounter (Signed)
Patient is calling for a refill of nail  fungus (Fluconazole)medication, has not been seen in office since 04/06/20). Please advise/schedule f/u appointment for nail fungus.

## 2020-11-03 ENCOUNTER — Telehealth: Payer: Self-pay | Admitting: *Deleted

## 2020-11-03 NOTE — Telephone Encounter (Signed)
Patient is calling for a refill of nail fungus medication but needs a f/u with Dr Jacqualyn Posey for reevaluation. Please schedule appointment.

## 2020-11-08 ENCOUNTER — Other Ambulatory Visit: Payer: Self-pay | Admitting: Podiatry

## 2020-11-08 DIAGNOSIS — Z79899 Other long term (current) drug therapy: Secondary | ICD-10-CM

## 2020-11-08 NOTE — Progress Notes (Signed)
Ordered blood work before proceed with further medication. I have called and spoken with the patient. He states it went away but has started to come back and wants to get back on the medication. He has not been on the medication for about a month.

## 2020-11-15 ENCOUNTER — Other Ambulatory Visit: Payer: Self-pay | Admitting: Podiatry

## 2020-11-15 NOTE — Telephone Encounter (Signed)
Please advise 

## 2020-12-22 ENCOUNTER — Other Ambulatory Visit: Payer: PRIVATE HEALTH INSURANCE

## 2020-12-22 ENCOUNTER — Telehealth: Payer: PRIVATE HEALTH INSURANCE | Admitting: Medical

## 2020-12-22 ENCOUNTER — Encounter: Payer: Self-pay | Admitting: Medical

## 2020-12-22 ENCOUNTER — Other Ambulatory Visit: Payer: Self-pay | Admitting: Internal Medicine

## 2020-12-22 ENCOUNTER — Other Ambulatory Visit: Payer: Self-pay

## 2020-12-22 VITALS — Wt 141.0 lb

## 2020-12-22 DIAGNOSIS — J988 Other specified respiratory disorders: Secondary | ICD-10-CM

## 2020-12-22 DIAGNOSIS — R059 Cough, unspecified: Secondary | ICD-10-CM

## 2020-12-22 MED ORDER — HYDROCOD POLST-CPM POLST ER 10-8 MG/5ML PO SUER: 5.0000 mL | Freq: Two times a day (BID) | ORAL | 0 refills | Status: DC

## 2020-12-22 MED ORDER — EMERGEN-C IMMUNE PLUS PO PACK: 1.0000 | PACK | Freq: Two times a day (BID) | ORAL | 0 refills | Status: DC

## 2020-12-22 NOTE — Progress Notes (Signed)
Subjective:     Patient ID: Bryan Ruiz, male   DOB: Aug 17, 1975, 45 y.o.   MRN: [id number]  This visit type was conducted due to national recommendations for restrictions regarding the COVID-19 Pandemic (e.g. social distancing) in an effort to limit this patient's exposure and mitigate transmission in our community.  Due to their co-morbid illnesses, this patient is at least at moderate risk for complications without adequate follow up.  This format is felt to be most appropriate for this patient at this time.    Documentation for virtual audio and video telecommunications through [facility] encounter:  The patient was located at home. The provider was located in the office. The patient did consent to this visit and is aware of possible charges through their insurance for this visit.  The other persons participating in this telemedicine service were none. Time spent on call was 20 minutes and in review of previous records 20 minutes total.  This virtual service is not related to other E/M service within previous 7 days.   HPI Chief Complaint  Patient presents with   possible covid    Symptoms- coughing , runny nose, no taste, started last night. Has taken theraflu    Virtual consult for illness.  Started yesterday with chest cold symptoms including coughing, runny nose, head stopped up.   Taste went away.  No fever.  No body aches, no chills.  No NVD.  Some stopped up nose ,but no SOB or wheezing.  No sore throat or ear pain.  Has some irritated neck glands.  Using theraflu.  Did a home covid test today that was negative.  No sick contacts.  No other aggravating or relieving factors. No other complaint.  Past Medical History:  Diagnosis Date   History of tinea cruris    History of vertigo 2009   once prior as of 11/2014    Onychomycosis    No current outpatient medications on file prior to visit.   No current facility-administered medications on file prior to visit.      Review of Systems As in subjective    Objective:   Physical Exam Due to coronavirus pandemic stay at home measures, patient visit was virtual and they were not examined in person.   Wt 141 lb (64 kg)   BMI 20.23 kg/m   Gen: wd, well nourished, no acute distress Nose stopped up sounding Coughing during interview but no witnessed labored breathing or shortness of breath     Assessment:     Encounter Diagnoses  Name Primary?   Respiratory tract infection Yes   Cough, unspecified type        Plan:     We discussed the symptoms and concerns.  We discussed the etiology could be viral cold, COVID or other respiratory tract infection.  He had a negative COVID test at home.  He will come into our back parking lot here shortly for a PCR test to help determine quarantine time  Advised rest, hydration, ibuprofen for aches and pains and fever, can use the Tussionex as needed but caution with sedation.  Alternate with the TheraFlu.  We discussed  Quarantine for now until we get the PCR test back  If much worse over the weekend or new symptoms then call the after-hours line over the weekend or get reevaluated  Otherwise advised the most colds and viral respiratory symptoms resolved within around 7 days typically  Bryan Ruiz was seen today for possible covid.  Diagnoses and all orders for  this visit:  Respiratory tract infection  Cough, unspecified type  Other orders -     chlorpheniramine-HYDROcodone (TUSSIONEX PENNKINETIC ER) 10-8 MG/5ML SUER; Take 5 mLs by mouth 2 (two) times daily. -     Multiple Vitamins-Minerals (EMERGEN-C IMMUNE PLUS) PACK; Take 1 tablet by mouth 2 (two) times daily.  F/u in our back parking lot for PCR COVID test today

## 2021-07-01 NOTE — Progress Notes (Signed)
? ?Complete physical exam ? ? ?Patient: Bryan Ruiz   DOB: May 09, 1975   46 y.o. Male  MRN: [id number] ?Visit Date: 07/02/2021 ? ?Chief Complaint  ?Patient presents with  ? Annual Exam  ?  None fasting CPE- wants a referral for knot on forehead.  ? ?Subjective  ?  ?Bryan Ruiz is a 46 y.o. male who presents today for a complete physical exam.  ? ?Reports is generally feeling well, fairly well, poorly; is eating a healthy diet; states he's had a firm bump on his right forehead for > 20 years and requests to have it evaluated, states when he wears a baseball hat, the bump gets uncomfortable from the rim sitting on his forehead ? ? ?HPI ?HPI   ? ? Annual Exam   ? Additional comments: None fasting CPE- wants a referral for knot on forehead. ? ?  ?  ?Last edited by Deforest Hoyles, [place] on 07/02/2021  1:54 PM.  ?  ?  ? ? ?Past Medical History:  ?Diagnosis Date  ? History of tinea cruris   ? History of vertigo 2009  ? once prior as of 11/2014   ? Onychomycosis   ? ?Past Surgical History:  ?Procedure Laterality Date  ? VASECTOMY    ? ?Social History  ? ?Socioeconomic History  ? Marital status: Single  ?  Spouse name: Not on file  ? Number of children: Not on file  ? Years of education: Not on file  ? Highest education level: Not on file  ?Occupational History  ? Not on file  ?Tobacco Use  ? Smoking status: Never  ? Smokeless tobacco: Never  ?Vaping Use  ? Vaping Use: Never used  ?Substance and Sexual Activity  ? Alcohol use: No  ? Drug use: No  ? Sexual activity: Yes  ?  Partners: Female  ?Other Topics Concern  ? Not on file  ?Social History Narrative  ? Not on file  ? ?Social Determinants of Health  ? ?Financial Resource Strain: Not on file  ?Food Insecurity: Not on file  ?Transportation Needs: Not on file  ?Physical Activity: Not on file  ?Stress: Not on file  ?Social Connections: Not on file  ?Intimate Partner Violence: Not on file  ? ?Family Status  ?Relation Name Status  ? Mother  Alive  ? Father  Alive  ? MGM  Alive   ? MGF  Alive  ? PGM  Deceased  ? PGF  Deceased  ? Neg Hx  (Not Specified)  ? ?Family History  ?Problem Relation Age of Onset  ? Diabetes Mother   ? Hypertension Mother   ? Hyperlipidemia Mother   ? Prostate cancer Father 1  ?     surgery   ? Hypertension Father   ? Hyperlipidemia Father   ? Diabetes Maternal Grandmother   ? Kidney disease Maternal Grandmother   ? CVA Neg Hx   ? ?No Known Allergies  ?Patient Care Team: ?Marcellina Millin as PCP - General (Physician Assistant)  ? ?Medications: ?Outpatient Medications Prior to Visit  ?Medication Sig  ? meloxicam (MOBIC) 7.5 MG tablet Take 7.5 mg by mouth daily.  ? [DISCONTINUED] chlorpheniramine-HYDROcodone (TUSSIONEX PENNKINETIC ER) 10-8 MG/5ML SUER Take 5 mLs by mouth 2 (two) times daily. (Patient not taking: Reported on 07/02/2021)  ? [DISCONTINUED] Multiple Vitamins-Minerals (EMERGEN-C IMMUNE PLUS) PACK Take 1 tablet by mouth 2 (two) times daily. (Patient not taking: Reported on 07/02/2021)  ? ?No facility-administered medications prior to visit.  ? ? ?  Review of Systems  ?Constitutional:  Negative for activity change and fever.  ?HENT:  Negative for congestion, ear pain and voice change.   ?Eyes:  Negative for redness.  ?Respiratory:  Negative for cough.   ?Cardiovascular:  Negative for chest pain.  ?Gastrointestinal:  Negative for constipation and diarrhea.  ?Endocrine: Negative for polyuria.  ?Genitourinary:  Negative for flank pain.  ?Musculoskeletal:  Negative for gait problem and neck stiffness.  ?Skin:  Negative for color change, pallor, rash and wound.  ?Neurological:  Negative for dizziness.  ?Hematological:  Negative for adenopathy.  ?Psychiatric/Behavioral:  Negative for agitation, behavioral problems and confusion.   ? ?Last CBC ?Lab Results  ?Component Value Date  ? WBC 4.0 07/02/2021  ? HGB 14.9 07/02/2021  ? HCT 42.8 07/02/2021  ? MCV 87 07/02/2021  ? MCH 30.2 07/02/2021  ? RDW 12.2 07/02/2021  ? PLT 187 07/02/2021  ? ?Last metabolic panel ?Lab  Results  ?Component Value Date  ? GLUCOSE 79 07/02/2021  ? NA 143 07/02/2021  ? K 4.4 07/02/2021  ? CL 105 07/02/2021  ? CO2 25 07/02/2021  ? BUN 12 07/02/2021  ? CREATININE 1.06 07/02/2021  ? EGFR 89 07/02/2021  ? CALCIUM 8.2 (L) 07/02/2021  ? PROT 7.3 07/02/2021  ? ALBUMIN 4.6 07/02/2021  ? LABGLOB 2.7 07/02/2021  ? AGRATIO 1.7 07/02/2021  ? BILITOT 1.3 (H) 07/02/2021  ? ALKPHOS 34 (L) 07/02/2021  ? AST 20 07/02/2021  ? ALT 28 07/02/2021  ? ANIONGAP 4 (L) 10/17/2015  ? ?Last lipids ?Lab Results  ?Component Value Date  ? CHOL 188 07/02/2021  ? HDL 49 07/02/2021  ? LDLCALC 116 (H) 07/02/2021  ? TRIG 129 07/02/2021  ? CHOLHDL 3.8 07/02/2021  ? ?Last hemoglobin A1c ?No results found for: HGBA1C ?  ? ?The 10-year ASCVD risk score (Arnett DK, et al., 2019) is: 3.4% ? ? Objective  ?  ?BP 120/70   Pulse 83   Ht '5\' 10"'  (1.778 m)   Wt 150 lb 9.6 oz (68.3 kg)   SpO2 97%   BMI 21.61 kg/m?  ? ?  ? ? ?Physical Exam ?Vitals and nursing note reviewed.  ?Constitutional:   ?   General: He is not in acute distress. ?   Appearance: Normal appearance.  ?HENT:  ?   Head: Normocephalic and atraumatic. Mass present. No abrasion, contusion or laceration. Hair is normal.  ? ?   Right Ear: Tympanic membrane, ear canal and external ear normal.  ?   Left Ear: Tympanic membrane, ear canal and external ear normal.  ?   Nose: No congestion.  ?Eyes:  ?   Extraocular Movements: Extraocular movements intact.  ?   Conjunctiva/sclera: Conjunctivae normal.  ?   Pupils: Pupils are equal, round, and reactive to light.  ?Neck:  ?   Vascular: No carotid bruit.  ?Cardiovascular:  ?   Rate and Rhythm: Normal rate and regular rhythm.  ?   Pulses: Normal pulses.  ?   Heart sounds: Normal heart sounds.  ?Pulmonary:  ?   Effort: Pulmonary effort is normal.  ?   Breath sounds: Normal breath sounds. No wheezing.  ?Abdominal:  ?   General: Bowel sounds are normal.  ?   Palpations: Abdomen is soft.  ?Musculoskeletal:     ?   General: Normal range of motion.  ?    Cervical back: Normal range of motion and neck supple.  ?   Right lower leg: No edema.  ?   Left  lower leg: No edema.  ?Skin: ?   General: Skin is warm and dry.  ?   Findings: No rash.  ?Neurological:  ?   Mental Status: He is alert and oriented to person, place, and time.  ?   Gait: Gait normal.  ?Psychiatric:     ?   Mood and Affect: Mood normal.     ?   Behavior: Behavior normal.  ?  ? ? ?Last depression screening scores ? ?  07/02/2021  ?  1:55 PM 06/29/2020  ? 10:16 AM 12/06/2019  ?  1:49 PM  ?PHQ 2/9 Scores  ?PHQ - 2 Score 0 0 0  ? ?Last fall risk screening ? ?  07/02/2021  ?  1:55 PM  ?Fall Risk   ?Falls in the past year? 0  ?Number falls in past yr: 0  ?Injury with Fall? 0  ?Risk for fall due to : No Fall Risks  ?Follow up Falls evaluation completed  ? ? ? ?Results for orders placed or performed in visit on 07/02/21  ?CBC with Differential/Platelet  ?Result Value Ref Range  ? WBC 4.0 3.4 - 10.8 x10E3/uL  ? RBC 4.94 4.14 - 5.80 x10E6/uL  ? Hemoglobin 14.9 13.0 - 17.7 g/dL  ? Hematocrit 42.8 37.5 - 51.0 %  ? MCV 87 79 - 97 fL  ? MCH 30.2 26.6 - 33.0 pg  ? MCHC 34.8 31.5 - 35.7 g/dL  ? RDW 12.2 11.6 - 15.4 %  ? Platelets 187 150 - 450 x10E3/uL  ? Neutrophils 43 Not Estab. %  ? Lymphs 37 Not Estab. %  ? Monocytes 9 Not Estab. %  ? Eos 9 Not Estab. %  ? Basos 2 Not Estab. %  ? Neutrophils Absolute 1.7 1.4 - 7.0 x10E3/uL  ? Lymphocytes Absolute 1.5 0.7 - 3.1 x10E3/uL  ? Monocytes Absolute 0.4 0.1 - 0.9 x10E3/uL  ? EOS (ABSOLUTE) 0.3 0.0 - 0.4 x10E3/uL  ? Basophils Absolute 0.1 0.0 - 0.2 x10E3/uL  ? Immature Granulocytes 0 Not Estab. %  ? Immature Grans (Abs) 0.0 0.0 - 0.1 x10E3/uL  ?Comprehensive metabolic panel  ?Result Value Ref Range  ? Glucose 79 70 - 99 mg/dL  ? BUN 12 6 - 24 mg/dL  ? Creatinine, Ser 1.06 0.76 - 1.27 mg/dL  ? eGFR 89 >59 mL/min/1.73  ? BUN/Creatinine Ratio 11 9 - 20  ? Sodium 143 134 - 144 mmol/L  ? Potassium 4.4 3.5 - 5.2 mmol/L  ? Chloride 105 96 - 106 mmol/L  ? CO2 25 20 - 29 mmol/L  ? Calcium  8.2 (L) 8.7 - 10.2 mg/dL  ? Total Protein 7.3 6.0 - 8.5 g/dL  ? Albumin 4.6 4.0 - 5.0 g/dL  ? Globulin, Total 2.7 1.5 - 4.5 g/dL  ? Albumin/Globulin Ratio 1.7 1.2 - 2.2  ? Bilirubin Total 1.3 (H) 0.0 - 1.2

## 2021-07-02 ENCOUNTER — Encounter: Payer: Self-pay | Admitting: Physician Assistant

## 2021-07-02 ENCOUNTER — Ambulatory Visit (INDEPENDENT_AMBULATORY_CARE_PROVIDER_SITE_OTHER): Payer: PRIVATE HEALTH INSURANCE | Admitting: Physician Assistant

## 2021-07-02 VITALS — BP 120/70 | HR 83 | Ht 70.0 in | Wt 150.6 lb

## 2021-07-02 DIAGNOSIS — Z0000 Encounter for general adult medical examination without abnormal findings: Secondary | ICD-10-CM | POA: Diagnosis not present

## 2021-07-02 DIAGNOSIS — R220 Localized swelling, mass and lump, head: Secondary | ICD-10-CM

## 2021-07-02 DIAGNOSIS — Z6821 Body mass index (BMI) 21.0-21.9, adult: Secondary | ICD-10-CM | POA: Diagnosis not present

## 2021-07-02 DIAGNOSIS — E785 Hyperlipidemia, unspecified: Secondary | ICD-10-CM | POA: Diagnosis not present

## 2021-07-02 NOTE — Patient Instructions (Addendum)
Preventative Care for Adults, Male ?   ?   REGULAR HEALTH EXAMS: ?A routine yearly physical is a good way to check in with your primary care provider about your health and preventive screening. It is also an opportunity to share updates about your health and any concerns you have, and receive a thorough all-over exam.  ?Most health insurance companies pay for at least some preventative services.  Check with your health plan for specific coverages. ? ?WHAT PREVENTATIVE SERVICES DO MEN NEED? ?Adult men should have their weight and blood pressure checked regularly.  ?Men age 35 and older should have their cholesterol levels checked regularly. ?Beginning at age 45 and continuing to age 75, men should be screened for colorectal cancer.  Certain people should may need continued testing until age 85. ?Other cancer screening may include exams for testicular and prostate cancer. ?Updating vaccinations is part of preventative care.  Vaccinations help protect against diseases such as the flu. ?Lab tests are generally done as part of preventative care to screen for anemia and blood disorders, to screen for problems with the kidneys and liver, to screen for bladder problems, to check blood sugar, and to check your cholesterol level. ?Preventative services generally include counseling about diet, exercise, avoiding tobacco, drugs, excessive alcohol consumption, and sexually transmitted infections.   ? ?GENERAL RECOMMENDATIONS FOR GOOD HEALTH: ? ?Healthy diet: ?Eat a variety of foods, including fruit, vegetables, animal or vegetable protein, such as meat, fish, chicken, and eggs, or beans, lentils, tofu, and grains, such as rice. ?Drink plenty of water daily (60 - 80 ounces or 8 - 10 glasses of water a day) ?Decrease saturated fat in the diet, avoid lots of red meat, processed foods, sweets, fast foods, and fried foods. For high cholesterol - Increase fiber intake (Benefiber or Metamucil, Cherrios,  oatmeal, beans, nuts, fruits  and vegetables), limit saturated fats (in fried foods, red meat), can add OTC fish oil supplement, eat fish with Omega-3 fatty acids like salmon and tuna, exercise for 30 minutes 3 - 5 times a week, drink 8 - 10 glasses of water a day. ? ?Exercise: ?Aerobic exercise helps maintain good heart health. At least 30-40 minutes of moderate-intensity exercise is recommended. For example, a brisk walk that increases your heart rate and breathing. This should be done on most days of the week.  ?Find a type of exercise or a variety of exercises that you enjoy so that it becomes a part of your daily life.  Examples are running, walking, swimming, water aerobics, and biking.  For motivation and support, explore group exercise such as aerobic class, spin class, Zumba, Yoga,or  martial arts, etc.   ?Set exercise goals for yourself, such as a certain weight goal, walk or run in a race such as a 5k walk/run.  Speak to your primary care provider about exercise goals. ? ?Disease prevention: ?If you smoke or chew tobacco, find out from your caregiver how to quit. It can literally save your life, no matter how long you have been a tobacco user. If you do not use tobacco, never begin.  ?Maintain a healthy diet and normal weight. Increased weight leads to problems with blood pressure and diabetes.  ?The Body Mass Index or BMI is a way of measuring how much of your body is fat. Having a BMI above 27 increases the risk of heart disease, diabetes, hypertension, stroke and other problems related to obesity. Your caregiver can help determine your BMI and based on it develop   an exercise and dietary program to help you achieve or maintain this important measurement at a healthful level. ?High blood pressure causes heart and blood vessel problems.  Persistent high blood pressure should be treated with medicine if weight loss and exercise do not work.  ?Fat and cholesterol leaves deposits in your arteries that can block them. This causes heart  disease and vessel disease elsewhere in your body.  If your cholesterol is found to be high, or if you have heart disease or certain other medical conditions, then you may need to have your cholesterol monitored frequently and be treated with medication.  ?Ask if you should have a stress test if your history suggests this. A stress test is a test done on a treadmill that looks for heart disease. This test can find disease prior to there being a problem. ?Avoid drinking alcohol in excess (more than two drinks per day).  Avoid use of street drugs. Do not share needles with anyone. Ask for professional help if you need assistance or instructions on stopping the use of alcohol, cigarettes, and/or drugs. ?Brush your teeth twice a day with fluoride toothpaste, and floss once a day. Good oral hygiene prevents tooth decay and gum disease. The problems can be painful, unattractive, and can cause other health problems. Visit your dentist for a routine oral and dental check up and preventive care every 6-12 months.  ?Look at your skin regularly.  Use a mirror to look at your back. Notify your caregivers of changes in moles, especially if there are changes in shapes, colors, a size larger than a pencil eraser, an irregular border, or development of new moles. ? ?Safety: ?Use seatbelts 100% of the time, whether driving or as a passenger.  Use safety devices such as hearing protection if you work in environments with loud noise or significant background noise.  Use safety glasses when doing any work that could send debris in to the eyes.  Use a helmet if you ride a bike or motorcycle.  Use appropriate safety gear for contact sports.  Talk to your caregiver about gun safety. ?Use sunscreen with a SPF (or skin protection factor) of 15 or greater.  Lighter skinned people are at a greater risk of skin cancer. Don?t forget to also wear sunglasses in order to protect your eyes from too much damaging sunlight. Damaging sunlight can  accelerate cataract formation.  ?Practice safe sex. Use condoms. Condoms are used for birth control and to help reduce the spread of sexually transmitted infections (or STIs).  Some of the STIs are gonorrhea (the clap), chlamydia, syphilis, trichomonas, herpes, HPV (human papilloma virus) and HIV (human immunodeficiency virus) which causes AIDS. The herpes, HIV and HPV are viral illnesses that have no cure. These can result in disability, cancer and death.  ?Keep carbon monoxide and smoke detectors in your home functioning at all times. Change the batteries every 6 months or use a model that plugs into the wall.  ? ?Vaccinations: ?Stay up to date with your tetanus shots and other required immunizations. You should have a booster for tetanus every 10 years. Be sure to get your flu shot every year, since 5%-20% of the [place] population comes down with the flu. The flu vaccine changes each year, so being vaccinated once is not enough. Get your shot in the fall, before the flu season peaks.   ?Other vaccines to consider: ?Pneumococcal vaccine to protect against certain types of pneumonia.  This is normally recommended for adults age   65 or older.  However, adults younger than 44 years old with certain underlying conditions such as diabetes, heart or lung disease should also receive the vaccine. ?Shingles vaccine to protect against Varicella Zoster if you are older than age 60, or younger than 44 years old with certain underlying illness. ?Hepatitis A vaccine to protect against a form of infection of the liver by a virus acquired from food. ?Hepatitis B vaccine to protect against a form of infection of the liver by a virus acquired from blood or body fluids, particularly if you work in health care. ?If you plan to travel internationally, check with your local health department for specific vaccination recommendations. ? ?Cancer Screening: ?Most routine colon cancer screening begins at the age of 50. On a yearly basis,  doctors may provide special easy to use take-home tests to check for hidden blood in the stool. Sigmoidoscopy or colonoscopy can detect the earliest forms of colon cancer and is life saving. These tests use a smal

## 2021-07-03 ENCOUNTER — Encounter: Payer: Self-pay | Admitting: Physician Assistant

## 2021-07-03 DIAGNOSIS — E785 Hyperlipidemia, unspecified: Secondary | ICD-10-CM | POA: Insufficient documentation

## 2021-07-03 LAB — COMPREHENSIVE METABOLIC PANEL
ALT: 28 IU/L (ref 0–44)
AST: 20 IU/L (ref 0–40)
Albumin/Globulin Ratio: 1.7 (ref 1.2–2.2)
Albumin: 4.6 g/dL (ref 4.0–5.0)
Alkaline Phosphatase: 34 IU/L — ABNORMAL LOW (ref 44–121)
BUN/Creatinine Ratio: 11 (ref 9–20)
BUN: 12 mg/dL (ref 6–24)
Bilirubin Total: 1.3 mg/dL — ABNORMAL HIGH (ref 0.0–1.2)
CO2: 25 mmol/L (ref 20–29)
Calcium: 8.2 mg/dL — ABNORMAL LOW (ref 8.7–10.2)
Chloride: 105 mmol/L (ref 96–106)
Creatinine, Ser: 1.06 mg/dL (ref 0.76–1.27)
Globulin, Total: 2.7 g/dL (ref 1.5–4.5)
Glucose: 79 mg/dL (ref 70–99)
Potassium: 4.4 mmol/L (ref 3.5–5.2)
Sodium: 143 mmol/L (ref 134–144)
Total Protein: 7.3 g/dL (ref 6.0–8.5)
eGFR: 89 mL/min/{1.73_m2} (ref >59)

## 2021-07-03 LAB — LIPID PANEL
Chol/HDL Ratio: 3.8 ratio (ref 0.0–5.0)
Cholesterol, Total: 188 mg/dL (ref 100–199)
HDL: 49 mg/dL (ref >39)
LDL Chol Calc (NIH): 116 mg/dL — ABNORMAL HIGH (ref 0–99)
Triglycerides: 129 mg/dL (ref 0–149)
VLDL Cholesterol Cal: 23 mg/dL (ref 5–40)

## 2021-07-03 LAB — CBC WITH DIFFERENTIAL/PLATELET
Basophils Absolute: 0.1 10*3/uL (ref 0.0–0.2)
Basos: 2 %
EOS (ABSOLUTE): 0.3 10*3/uL (ref 0.0–0.4)
Eos: 9 %
Hematocrit: 42.8 % (ref 37.5–51.0)
Hemoglobin: 14.9 g/dL (ref 13.0–17.7)
Immature Grans (Abs): 0 10*3/uL (ref 0.0–0.1)
Immature Granulocytes: 0 %
Lymphocytes Absolute: 1.5 10*3/uL (ref 0.7–3.1)
Lymphs: 37 %
MCH: 30.2 pg (ref 26.6–33.0)
MCHC: 34.8 g/dL (ref 31.5–35.7)
MCV: 87 fL (ref 79–97)
Monocytes Absolute: 0.4 10*3/uL (ref 0.1–0.9)
Monocytes: 9 %
Neutrophils Absolute: 1.7 10*3/uL (ref 1.4–7.0)
Neutrophils: 43 %
Platelets: 187 10*3/uL (ref 150–450)
RBC: 4.94 x10E6/uL (ref 4.14–5.80)
RDW: 12.2 % (ref 11.6–15.4)
WBC: 4 10*3/uL (ref 3.4–10.8)

## 2021-07-03 NOTE — Assessment & Plan Note (Signed)
Stable, will monitor 

## 2021-07-03 NOTE — Assessment & Plan Note (Signed)
controlled, lipid panel checked today, eat a low fat diet, increase fiber intake (Benefiber or Metamucil, Cherrios,  oatmeal, beans, nuts, fruits and vegetables), limit saturated fats (in fried foods, red meat), can add OTC fish oil supplement, eat fish with Omega-3 fatty acids like salmon and tuna, exercise for 30 minutes 3 - 5 times a week, drink 8 - 10 glasses of water a day ? ? ?

## 2021-07-18 ENCOUNTER — Ambulatory Visit (INDEPENDENT_AMBULATORY_CARE_PROVIDER_SITE_OTHER): Payer: 59 | Admitting: Plastic Surgery

## 2021-07-18 ENCOUNTER — Encounter: Payer: Self-pay | Admitting: Plastic Surgery

## 2021-07-18 VITALS — BP 107/72 | HR 95 | Ht 69.0 in | Wt 152.0 lb

## 2021-07-18 DIAGNOSIS — D164 Benign neoplasm of bones of skull and face: Secondary | ICD-10-CM | POA: Diagnosis not present

## 2021-07-18 NOTE — Progress Notes (Signed)
? ?  Referring Provider ?[name], PA-C ?[address] ?[place],  [place] [address]  ? ?CC:  ?Chief Complaint  ?Patient presents with  ? Consult  ?   ?  ?   ? ?Bryan Ruiz is an 46 y.o. male.  ?HPI: Patient presents with a mass on his right forehead.  Is been present since at least 2006.  He feels like is growing in size.  It is intermittently painful.  He is bothered by it and would like to have it removed if possible. ? ?No Known Allergies ? ?Outpatient Encounter Medications as of 07/18/2021  ?Medication Sig  ? meloxicam (MOBIC) 7.5 MG tablet Take 7.5 mg by mouth daily.  ? ?No facility-administered encounter medications on file as of 07/18/2021.  ?  ? ?Past Medical History:  ?Diagnosis Date  ? History of tinea cruris   ? History of vertigo 2009  ? once prior as of 11/2014   ? Onychomycosis   ? ? ?Past Surgical History:  ?Procedure Laterality Date  ? VASECTOMY    ? ? ?Family History  ?Problem Relation Age of Onset  ? Diabetes Mother   ? Hypertension Mother   ? Hyperlipidemia Mother   ? Prostate cancer Father 46  ?     surgery   ? Hypertension Father   ? Hyperlipidemia Father   ? Diabetes Maternal Grandmother   ? Kidney disease Maternal Grandmother   ? CVA Neg Hx   ? ? ?Social History  ? ?Social History Narrative  ? Not on file  ?  ? ?Review of Systems ?General: Denies fevers, chills, weight loss ?CV: Denies chest pain, shortness of breath, palpitations ? ?Physical Exam ? ?  07/18/2021  ?  2:15 PM 07/02/2021  ?  1:56 PM 12/22/2020  ?  9:48 AM  ?Vitals with BMI  ?Height '5\' 9"'$  '5\' 10"'$    ?Weight 152 lbs 150 lbs 10 oz 141 lbs  ?BMI 22.44 21.61   ?Systolic 888 280   ?Diastolic 72 70   ?Pulse 95 83   ?  ?General:  No acute distress,  Alert and oriented, Non-Toxic, Normal speech and affect ?Examination shows a 3 cm firm fixed mass in the right upper forehead.  It feels like an osteoma.  No overlying skin changes. ? ?Assessment/Plan ?Patient presents with what looks like a bony mass in the right upper forehead.  I  discussed getting a CT scan to ensure benign appearing pathology.  Assuming that it is an osteoma I could make an incision and bur that down to improve the contour.  We discussed risks include bleeding, infection, damage to surrounding structures need for additional procedures.  All of his questions were answered and he is interested in moving forward. ? ?Cindra Presume ?07/18/2021, 2:55 PM  ? ? ?  ?

## 2021-08-01 ENCOUNTER — Telehealth: Payer: Self-pay | Admitting: *Deleted

## 2021-08-01 NOTE — Telephone Encounter (Signed)
Pt called at 1140 am to check on status of scheduling CT scan ordered by Dr. Claudia Desanctis. Pt states he has no preference for location "wherever is quickest". Insurance benefits verified. Order info routed to [employer].

## 2021-08-02 ENCOUNTER — Ambulatory Visit (HOSPITAL_BASED_OUTPATIENT_CLINIC_OR_DEPARTMENT_OTHER)
Admission: RE | Admit: 2021-08-02 | Discharge: 2021-08-02 | Disposition: A | Discharge status: Home / Self Care | Payer: Managed Care, Other (non HMO) | Source: Ambulatory Visit | Attending: Plastic Surgery | Admitting: Plastic Surgery

## 2021-08-02 ENCOUNTER — Encounter (HOSPITAL_BASED_OUTPATIENT_CLINIC_OR_DEPARTMENT_OTHER): Payer: Self-pay

## 2021-08-02 DIAGNOSIS — D164 Benign neoplasm of bones of skull and face: Secondary | ICD-10-CM | POA: Diagnosis not present

## 2021-08-06 NOTE — Progress Notes (Unsigned)
Patient ID: Bryan Ruiz, male    DOB: December 20, 1975, 46 y.o.   MRN: [id number]  No chief complaint on file.   No diagnosis found.   History of Present Illness: Bryan Ruiz is a 46 y.o.  male  with a history of ***.  He presents for preoperative evaluation for upcoming procedure, ***, scheduled for *** with Dr. Donne Hazel.  The patient {HAS HAS [id number]} had problems with anesthesia. ***  Summary of Previous Visit: ***  Job: ***  PMH Significant for: ***   Past Medical History: Allergies: No Known Allergies  Current Medications:  Current Outpatient Medications:    meloxicam (MOBIC) 7.5 MG tablet, Take 7.5 mg by mouth daily., Disp: , Rfl:   Past Medical Problems: Past Medical History:  Diagnosis Date   History of tinea cruris    History of vertigo 2009   once prior as of 11/2014    Onychomycosis     Past Surgical History: Past Surgical History:  Procedure Laterality Date   VASECTOMY      Social History: Social History   Socioeconomic History   Marital status: Single    Spouse name: Not on file   Number of children: Not on file   Years of education: Not on file   Highest education level: Not on file  Occupational History   Not on file  Tobacco Use   Smoking status: Never   Smokeless tobacco: Never  Vaping Use   Vaping Use: Never used  Substance and Sexual Activity   Alcohol use: No   Drug use: No   Sexual activity: Yes    Partners: Female  Other Topics Concern   Not on file  Social History Narrative   Not on file   Social Determinants of Health   Financial Resource Strain: Not on file  Food Insecurity: Not on file  Transportation Needs: Not on file  Physical Activity: Not on file  Stress: Not on file  Social Connections: Not on file  Intimate Partner Violence: Not on file    Family History: Family History  Problem Relation Age of Onset   Diabetes Mother    Hypertension Mother    Hyperlipidemia  Mother    Prostate cancer Father 54       surgery    Hypertension Father    Hyperlipidemia Father    Diabetes Maternal Grandmother    Kidney disease Maternal Grandmother    CVA Neg Hx     Review of Systems: ROS  Physical Exam: Vital Signs There were no vitals taken for this visit.  Physical Exam *** Constitutional:      General: Not in acute distress.    Appearance: Normal appearance. Not ill-appearing.  HENT:     Head: Normocephalic and atraumatic.  Eyes:     Pupils: Pupils are equal, round Neck:     Musculoskeletal: Normal range of motion.  Cardiovascular:     Rate and Rhythm: Normal rate    Pulses: Normal pulses.  Pulmonary:     Effort: Pulmonary effort is normal. No respiratory distress.  Abdominal:     General: Abdomen is flat. There is no distension.  Musculoskeletal: Normal range of motion.  Skin:    General: Skin is warm and dry.     Findings: No erythema or rash.  Neurological:     General: No focal deficit present.     Mental Status: Alert and oriented to person, place, and time. Mental status is at baseline.  Motor: No weakness.  Psychiatric:        Mood and Affect: Mood normal.        Behavior: Behavior normal.    Assessment/Plan: The patient is scheduled for *** with Dr. Donne Hazel.  Risks, benefits, and alternatives of procedure discussed, questions answered and consent obtained.    Smoking Status: ***; Counseling Given? *** Last Mammogram: ***; Results: ***  Caprini Score: ***; Risk Factors include: ***, BMI *** 25, and length of planned surgery. Recommendation for mechanical *** prophylaxis. Encourage early ambulation.   Pictures obtained: '@consult'$ ***  Post-op Rx sent to pharmacy: {Blank:19197::"Oxycodone, Zofran, Keflex","Oxycodone, Zofran"}  Patient was provided with the *** General Surgical Risk consent document and Pain Medication Agreement prior to their appointment.  They had adequate time to read  through the risk consent documents and Pain Medication Agreement. We also discussed them in person together during this preop appointment. All of their questions were answered to their satisfaction.  Recommended calling if they have any further questions.  Risk consent form and Pain Medication Agreement to be scanned into patient's chart.  ***   Electronically signed by: Carola Rhine Bryttney Netzer, PA-C 08/06/2021 1:09 PM

## 2021-08-08 ENCOUNTER — Encounter: Payer: Self-pay | Admitting: Surgical

## 2021-08-08 ENCOUNTER — Ambulatory Visit (INDEPENDENT_AMBULATORY_CARE_PROVIDER_SITE_OTHER): Payer: Managed Care, Other (non HMO) | Admitting: Surgical

## 2021-08-08 VITALS — BP 92/62 | HR 86 | Ht 69.0 in | Wt 148.4 lb

## 2021-08-08 DIAGNOSIS — D164 Benign neoplasm of bones of skull and face: Secondary | ICD-10-CM

## 2021-08-08 MED ORDER — OXYCODONE HCL 5 MG PO TABS: 5.0000 mg | ORAL_TABLET | Freq: Four times a day (QID) | ORAL | 0 refills | Status: AC | PRN

## 2021-08-08 MED ORDER — ONDANSETRON HCL 4 MG PO TABS: 4.0000 mg | ORAL_TABLET | Freq: Three times a day (TID) | ORAL | 0 refills | Status: DC | PRN

## 2021-08-09 MED ORDER — TRAMADOL HCL 50 MG PO TABS: 50.0000 mg | ORAL_TABLET | Freq: Four times a day (QID) | ORAL | 0 refills | Status: AC | PRN

## 2021-08-09 NOTE — Addendum Note (Signed)
Addended byRoetta Sessions on: 08/09/2021 03:57 PM   Modules accepted: Orders

## 2021-08-15 ENCOUNTER — Other Ambulatory Visit: Payer: Self-pay

## 2021-08-15 ENCOUNTER — Encounter (HOSPITAL_BASED_OUTPATIENT_CLINIC_OR_DEPARTMENT_OTHER): Payer: Self-pay | Admitting: Plastic Surgery

## 2021-08-16 ENCOUNTER — Telehealth: Payer: Self-pay | Admitting: Plastic Surgery

## 2021-08-16 NOTE — Telephone Encounter (Signed)
LVM advising patient that he surgery time has been moved up from 9:45 am to 7:30 am. Advised that he needs to arrive at 6:00 am now. Advised that he can call office back if he has further questions.

## 2021-08-24 ENCOUNTER — Encounter (HOSPITAL_BASED_OUTPATIENT_CLINIC_OR_DEPARTMENT_OTHER)
Admission: RE | Disposition: A | Discharge status: Home / Self Care | Payer: Self-pay | Source: Home / Self Care | Attending: Plastic Surgery

## 2021-08-24 ENCOUNTER — Other Ambulatory Visit: Payer: Self-pay

## 2021-08-24 ENCOUNTER — Encounter (HOSPITAL_BASED_OUTPATIENT_CLINIC_OR_DEPARTMENT_OTHER): Payer: Self-pay | Admitting: Plastic Surgery

## 2021-08-24 ENCOUNTER — Ambulatory Visit (HOSPITAL_BASED_OUTPATIENT_CLINIC_OR_DEPARTMENT_OTHER)
Admission: RE | Admit: 2021-08-24 | Discharge: 2021-08-24 | Disposition: A | Discharge status: Home / Self Care | Payer: Managed Care, Other (non HMO) | Attending: Plastic Surgery | Admitting: Plastic Surgery

## 2021-08-24 ENCOUNTER — Ambulatory Visit (HOSPITAL_BASED_OUTPATIENT_CLINIC_OR_DEPARTMENT_OTHER): Payer: Managed Care, Other (non HMO) | Admitting: Anesthesiology

## 2021-08-24 DIAGNOSIS — D169 Benign neoplasm of bone and articular cartilage, unspecified: Secondary | ICD-10-CM

## 2021-08-24 DIAGNOSIS — D164 Benign neoplasm of bones of skull and face: Secondary | ICD-10-CM | POA: Diagnosis present

## 2021-08-24 HISTORY — PX: EXCISION MASS HEAD: SHX6702

## 2021-08-24 SURGERY — EXCISION, MASS, HEAD
Anesthesia: General | Site: Face

## 2021-08-24 MED ORDER — 0.9 % SODIUM CHLORIDE (POUR BTL) OPTIME
TOPICAL | Status: DC | PRN
  Administered 2021-08-24: 450 mL

## 2021-08-24 MED ORDER — FENTANYL CITRATE (PF) 100 MCG/2ML IJ SOLN: 25.0000 ug | INTRAMUSCULAR | Status: DC | PRN

## 2021-08-24 MED ORDER — BUPIVACAINE-EPINEPHRINE 0.25% -1:200000 IJ SOLN
INTRAMUSCULAR | Status: DC | PRN
  Administered 2021-08-24: 10 mL

## 2021-08-24 MED ORDER — MIDAZOLAM HCL 2 MG/2ML IJ SOLN
INTRAMUSCULAR | Status: AC
  Filled 2021-08-24: qty 2

## 2021-08-24 MED ORDER — PROPOFOL 500 MG/50ML IV EMUL
INTRAVENOUS | Status: AC
  Filled 2021-08-24: qty 50

## 2021-08-24 MED ORDER — TRAMADOL HCL 50 MG PO TABS: 50.0000 mg | ORAL_TABLET | Freq: Four times a day (QID) | ORAL | 0 refills | Status: AC | PRN

## 2021-08-24 MED ORDER — ONDANSETRON HCL 4 MG/2ML IJ SOLN
INTRAMUSCULAR | Status: DC | PRN
  Administered 2021-08-24: 4 mg via INTRAVENOUS

## 2021-08-24 MED ORDER — CEFAZOLIN SODIUM-DEXTROSE 2-4 GM/100ML-% IV SOLN
INTRAVENOUS | Status: AC
  Filled 2021-08-24: qty 100

## 2021-08-24 MED ORDER — LACTATED RINGERS IV SOLN: INTRAVENOUS | Status: DC

## 2021-08-24 MED ORDER — LIDOCAINE 2% (20 MG/ML) 5 ML SYRINGE
INTRAMUSCULAR | Status: AC
  Filled 2021-08-24: qty 5

## 2021-08-24 MED ORDER — ONDANSETRON HCL 4 MG/2ML IJ SOLN
INTRAMUSCULAR | Status: AC
  Filled 2021-08-24: qty 2

## 2021-08-24 MED ORDER — DEXAMETHASONE SODIUM PHOSPHATE 4 MG/ML IJ SOLN
INTRAMUSCULAR | Status: DC | PRN
  Administered 2021-08-24: 10 mg via INTRAVENOUS

## 2021-08-24 MED ORDER — DEXAMETHASONE SODIUM PHOSPHATE 10 MG/ML IJ SOLN
INTRAMUSCULAR | Status: AC
  Filled 2021-08-24: qty 1

## 2021-08-24 MED ORDER — PROPOFOL 10 MG/ML IV BOLUS
INTRAVENOUS | Status: DC | PRN
  Administered 2021-08-24: 170 mg via INTRAVENOUS

## 2021-08-24 MED ORDER — BUPIVACAINE HCL (PF) 0.25 % IJ SOLN
INTRAMUSCULAR | Status: AC
  Filled 2021-08-24: qty 60

## 2021-08-24 MED ORDER — ACETAMINOPHEN 500 MG PO TABS
1000.0000 mg | ORAL_TABLET | Freq: Once | ORAL | Status: AC
  Administered 2021-08-24: 1000 mg via ORAL

## 2021-08-24 MED ORDER — ONDANSETRON HCL 4 MG/2ML IJ SOLN: 4.0000 mg | Freq: Once | INTRAMUSCULAR | Status: DC | PRN

## 2021-08-24 MED ORDER — CHLORHEXIDINE GLUCONATE CLOTH 2 % EX PADS: 6.0000 | MEDICATED_PAD | Freq: Once | CUTANEOUS | Status: DC

## 2021-08-24 MED ORDER — BACITRACIN ZINC 500 UNIT/GM EX OINT
TOPICAL_OINTMENT | CUTANEOUS | Status: AC
Start: 2021-08-24 — End: ?
  Filled 2021-08-24: qty 0.9

## 2021-08-24 MED ORDER — LIDOCAINE HCL (CARDIAC) PF 100 MG/5ML IV SOSY
PREFILLED_SYRINGE | INTRAVENOUS | Status: DC | PRN
  Administered 2021-08-24: 80 mg via INTRAVENOUS

## 2021-08-24 MED ORDER — PHENYLEPHRINE HCL (PRESSORS) 10 MG/ML IV SOLN
INTRAVENOUS | Status: DC | PRN
  Administered 2021-08-24: 80 ug via INTRAVENOUS
  Administered 2021-08-24: 120 ug via INTRAVENOUS

## 2021-08-24 MED ORDER — ACETAMINOPHEN 500 MG PO TABS
ORAL_TABLET | ORAL | Status: AC
  Filled 2021-08-24: qty 2

## 2021-08-24 MED ORDER — BUPIVACAINE-EPINEPHRINE (PF) 0.25% -1:200000 IJ SOLN
INTRAMUSCULAR | Status: AC
  Filled 2021-08-24: qty 60

## 2021-08-24 MED ORDER — MIDAZOLAM HCL 5 MG/5ML IJ SOLN
INTRAMUSCULAR | Status: DC | PRN
  Administered 2021-08-24: 2 mg via INTRAVENOUS

## 2021-08-24 MED ORDER — FENTANYL CITRATE (PF) 100 MCG/2ML IJ SOLN
INTRAMUSCULAR | Status: AC
  Filled 2021-08-24: qty 2

## 2021-08-24 MED ORDER — CEFAZOLIN SODIUM-DEXTROSE 2-4 GM/100ML-% IV SOLN: 2.0000 g | INTRAVENOUS | Status: DC

## 2021-08-24 MED ORDER — FENTANYL CITRATE (PF) 100 MCG/2ML IJ SOLN
INTRAMUSCULAR | Status: DC | PRN
  Administered 2021-08-24: 50 ug via INTRAVENOUS

## 2021-08-24 SURGICAL SUPPLY — 68 items
ADH SKN CLS APL DERMABOND .7 (GAUZE/BANDAGES/DRESSINGS)
APL SKNCLS STERI-STRIP NONHPOA (GAUZE/BANDAGES/DRESSINGS)
BAND INSRT 18 STRL LF DISP RB (MISCELLANEOUS)
BAND RUBBER #18 3X1/16 STRL (MISCELLANEOUS) IMPLANT
BENZOIN TINCTURE PRP APPL 2/3 (GAUZE/BANDAGES/DRESSINGS) IMPLANT
BLADE CLIPPER SURG (BLADE) IMPLANT
BLADE SURG 15 STRL LF DISP TIS (BLADE) ×1 IMPLANT
BLADE SURG 15 STRL SS (BLADE) ×2
BNDG CMPR 75X21 PLY HI ABS (MISCELLANEOUS)
BNDG ELASTIC 2X5.8 VLCR STR LF (GAUZE/BANDAGES/DRESSINGS) IMPLANT
BNDG GAUZE DERMACEA FLUFF (GAUZE/BANDAGES/DRESSINGS) ×1
BNDG GAUZE DERMACEA FLUFF 4 (GAUZE/BANDAGES/DRESSINGS) ×1 IMPLANT
BNDG GZE DERMACEA 4 6PLY (GAUZE/BANDAGES/DRESSINGS) ×1
BUR EGG 3PK/BX (BURR) IMPLANT
BUR PEAR (BURR) ×1 IMPLANT
CANISTER SUCT 1200ML W/VALVE (MISCELLANEOUS) ×2 IMPLANT
CORD BIPOLAR FORCEPS 12FT (ELECTRODE) IMPLANT
COVER BACK TABLE 60X90IN (DRAPES) ×2 IMPLANT
COVER MAYO STAND STRL (DRAPES) ×2 IMPLANT
DERMABOND ADVANCED (GAUZE/BANDAGES/DRESSINGS)
DERMABOND ADVANCED .7 DNX12 (GAUZE/BANDAGES/DRESSINGS) IMPLANT
DRAPE U-SHAPE 76X120 STRL (DRAPES) ×2 IMPLANT
DRSG TEGADERM 2-3/8X2-3/4 SM (GAUZE/BANDAGES/DRESSINGS) ×1 IMPLANT
DRSG TEGADERM 4X4.75 (GAUZE/BANDAGES/DRESSINGS) IMPLANT
ELECT COATED BLADE 2.86 ST (ELECTRODE) ×2 IMPLANT
ELECT NDL BLADE 2-5/6 (NEEDLE) IMPLANT
ELECT NEEDLE BLADE 2-5/6 (NEEDLE) IMPLANT
ELECT REM PT RETURN 9FT ADLT (ELECTROSURGICAL)
ELECT REM PT RETURN 9FT PED (ELECTROSURGICAL)
ELECTRODE REM PT RETRN 9FT PED (ELECTROSURGICAL) IMPLANT
ELECTRODE REM PT RTRN 9FT ADLT (ELECTROSURGICAL) IMPLANT
GAUZE SPONGE 4X4 12PLY STRL LF (GAUZE/BANDAGES/DRESSINGS) IMPLANT
GAUZE STRETCH 2X75IN STRL (MISCELLANEOUS) IMPLANT
GLOVE BIO SURGEON STRL SZ7.5 (GLOVE) ×5 IMPLANT
GLOVE BIOGEL PI IND STRL 6.5 (GLOVE) IMPLANT
GLOVE BIOGEL PI IND STRL 8 (GLOVE) ×2 IMPLANT
GLOVE BIOGEL PI INDICATOR 6.5 (GLOVE) ×1
GLOVE BIOGEL PI INDICATOR 8 (GLOVE) ×2
GLOVE ECLIPSE 6.5 STRL STRAW (GLOVE) ×1 IMPLANT
GOWN STRL REUS W/ TWL LRG LVL3 (GOWN DISPOSABLE) ×1 IMPLANT
GOWN STRL REUS W/ TWL XL LVL3 (GOWN DISPOSABLE) ×1 IMPLANT
GOWN STRL REUS W/TWL LRG LVL3 (GOWN DISPOSABLE) ×2
GOWN STRL REUS W/TWL XL LVL3 (GOWN DISPOSABLE) ×2
NDL HYPO 27GX1-1/4 (NEEDLE) ×1 IMPLANT
NEEDLE HYPO 27GX1-1/4 (NEEDLE) ×2 IMPLANT
NS IRRIG 1000ML POUR BTL (IV SOLUTION) IMPLANT
PACK BASIN DAY SURGERY FS (CUSTOM PROCEDURE TRAY) ×2 IMPLANT
PANTS MESH DISP 2XL (UNDERPADS AND DIAPERS) IMPLANT
PENCIL SMOKE EVACUATOR (MISCELLANEOUS) ×2 IMPLANT
SHEET MEDIUM DRAPE 40X70 STRL (DRAPES) IMPLANT
SLEEVE SCD COMPRESS KNEE MED (STOCKING) IMPLANT
SPIKE FLUID TRANSFER (MISCELLANEOUS) IMPLANT
SPONGE GAUZE 2X2 8PLY STRL LF (GAUZE/BANDAGES/DRESSINGS) ×1 IMPLANT
STOCKINETTE TUBULAR 6 INCH (GAUZE/BANDAGES/DRESSINGS) ×2 IMPLANT
SUCTION FRAZIER HANDLE 10FR (MISCELLANEOUS) ×2
SUCTION TUBE FRAZIER 10FR DISP (MISCELLANEOUS) ×1 IMPLANT
SUT BONE WAX W31G (SUTURE) IMPLANT
SUT MNCRL AB 3-0 PS2 18 (SUTURE) IMPLANT
SUT MNCRL AB 4-0 PS2 18 (SUTURE) IMPLANT
SUT MON AB 5-0 P3 18 (SUTURE) ×1 IMPLANT
SUT MON AB 5-0 PS2 18 (SUTURE) ×1 IMPLANT
SUT PLAIN 5 0 P 3 18 (SUTURE) ×2 IMPLANT
SUT VICRYL 4-0 PS2 18IN ABS (SUTURE) IMPLANT
SYR BULB EAR ULCER 3OZ GRN STR (SYRINGE) ×2 IMPLANT
SYR CONTROL 10ML LL (SYRINGE) ×2 IMPLANT
TOWEL GREEN STERILE FF (TOWEL DISPOSABLE) ×2 IMPLANT
TRAY DSU PREP LF (CUSTOM PROCEDURE TRAY) ×2 IMPLANT
TUBE CONNECTING 20X1/4 (TUBING) ×2 IMPLANT

## 2021-08-24 NOTE — Op Note (Signed)
Operative Note   DATE OF OPERATION: 08/24/2021  SURGICAL DEPARTMENT: Plastic Surgery  PREOPERATIVE DIAGNOSES: Right forehead osteoma  POSTOPERATIVE DIAGNOSES:  same  PROCEDURE: 1.  Excision right forehead osteoma totaling 3.5 cm 2.  Complex closure right forehead totaling 3.5 cm  SURGEON: Talmadge Coventry, MD  ASSISTANT: Verdie Shire, PA The advanced practice practitioner (APP) assisted throughout the case.  The APP was essential in retraction and counter traction when needed to make the case progress smoothly.  This retraction and assistance made it possible to see the tissue planes for the procedure.  The assistance was needed for hemostasis, tissue re-approximation and closure of the incision site.   ANESTHESIA:  General.   COMPLICATIONS: None.   INDICATIONS FOR PROCEDURE:  The patient, Bryan Ruiz is a 46 y.o. male born on [birth year], is here for treatment of right forehead osteoma MRN: [id number]  CONSENT:  Informed consent was obtained directly from the patient. Risks, benefits and alternatives were fully discussed. Specific risks including but not limited to bleeding, infection, hematoma, seroma, scarring, pain, contracture, asymmetry, wound healing problems, and need for further surgery were all discussed. The patient did have an ample opportunity to have questions answered to satisfaction.   DESCRIPTION OF PROCEDURE:  The patient was taken to the operating room. SCDs were placed and antibiotics were given.  General anesthesia was administered.  The patient's operative site was prepped and draped in a sterile fashion. A time out was performed and all information was confirmed to be correct.  Started by marking out the borders of the osteoma.  This was clearly palpable and about 3.5 cm in size.  I then infiltrated Marcaine with epinephrine to help with anesthesia and hemostasis.  Incision was then made transversely along the relaxed skin tension lines.  I dissected down to  the osteoma with a combination of cautery and tenotomy dissection.  The periosteum was then elevated off the osteoma circumferentially.  A bur was then utilized to shave it down flush with the surrounding bone.  Irrigation was performed followed by small refinements to ensure appropriate contour.  At this point I did ensure to irrigate out all remaining bony fragments.  The surrounding skin was then debrided to ensure fresh uninjured edges and the skin was undermined circumferentially and advanced and closed in layers with interrupted buried 5-0 Monocryl and 5-0 plain gut suture.  Soft dressing was applied.  The patient tolerated the procedure well.  There were no complications. The patient was allowed to wake from anesthesia, extubated and taken to the recovery room in satisfactory condition.

## 2021-08-24 NOTE — Anesthesia Preprocedure Evaluation (Addendum)
Anesthesia Evaluation  Patient identified by MRN, date of birth, ID band Patient awake    Reviewed: Allergy & Precautions, NPO status , Patient's Chart, lab work & pertinent test results  Airway Mallampati: II  TM Distance: >3 FB Neck ROM: Full    Dental  (+) Dental Advisory Given, Missing   Pulmonary neg pulmonary ROS,    Pulmonary exam normal breath sounds clear to auscultation       Cardiovascular negative cardio ROS Normal cardiovascular exam Rhythm:Regular Rate:Normal     Neuro/Psych Osteoma of skull    GI/Hepatic negative GI ROS, Neg liver ROS,   Endo/Other  negative endocrine ROS  Renal/GU negative Renal ROS     Musculoskeletal  (+) Arthritis ,   Abdominal   Peds  Hematology negative hematology ROS (+)   Anesthesia Other Findings Day of surgery medications reviewed with the patient.  Reproductive/Obstetrics                            Anesthesia Physical Anesthesia Plan  ASA: 1  Anesthesia Plan: General   Post-op Pain Management: Tylenol PO (pre-op)*   Induction: Intravenous  PONV Risk Score and Plan: 2 and Midazolam, Dexamethasone and Ondansetron  Airway Management Planned: LMA  Additional Equipment:   Intra-op Plan:   Post-operative Plan: Extubation in OR  Informed Consent: I have reviewed the patients History and Physical, chart, labs and discussed the procedure including the risks, benefits and alternatives for the proposed anesthesia with the patient or authorized representative who has indicated his/her understanding and acceptance.     Dental advisory given  Plan Discussed with: CRNA  Anesthesia Plan Comments:        Anesthesia Quick Evaluation

## 2021-08-24 NOTE — Interval H&P Note (Signed)
Patient seen and examined. Risks and benefits discussed. Proceed with surgery.

## 2021-08-27 ENCOUNTER — Telehealth: Payer: Self-pay

## 2021-08-27 ENCOUNTER — Encounter (HOSPITAL_BASED_OUTPATIENT_CLINIC_OR_DEPARTMENT_OTHER): Payer: Self-pay | Admitting: Plastic Surgery

## 2021-08-27 NOTE — Telephone Encounter (Signed)
Pt had surgery Friday to remove a mass on his forehead. He states he woke up this morning with a lot of swelling, barely able to open his eye. No bruising Saturday or Sunday. I informed that it is normal to have swelling and even bruising, however I just wanted to inform you just per his request. Please advise. Thanks.

## 2021-08-30 ENCOUNTER — Ambulatory Visit (INDEPENDENT_AMBULATORY_CARE_PROVIDER_SITE_OTHER): Payer: Managed Care, Other (non HMO) | Admitting: Plastic Surgery

## 2021-08-30 ENCOUNTER — Telehealth: Payer: Self-pay | Admitting: Plastic Surgery

## 2021-08-30 VITALS — BP 114/67 | HR 93

## 2021-08-30 DIAGNOSIS — D164 Benign neoplasm of bones of skull and face: Secondary | ICD-10-CM

## 2021-08-30 DIAGNOSIS — Z719 Counseling, unspecified: Secondary | ICD-10-CM

## 2021-08-30 NOTE — Progress Notes (Signed)
Patient presents just under 1 week postop from removal of a osteoma in the right frontal area.  He overall says things are coming along okay.  He has had some intermittent lightheadedness which is difficult to explain and he still has some numbness around the surgical site.  On exam the incision is healing nicely.  There is still some residual swelling but overall nice contour improvement.  No signs of subcutaneous fluid or infection.  I discussed with him the lightheaded nests persisting this long is difficult to explain.  He has a nonfocal neurologic exam and is otherwise at baseline neurologically.  It is possible he has been dehydrated or is still recovering from the change in routine from the anesthesia.  I have encouraged him to reach out to his primary doctor if this persists.  His vital signs today are normal.  He is fully understanding and we will see him again in 2 to 3 weeks.

## 2021-08-30 NOTE — Telephone Encounter (Addendum)
Pt came in and dropped off a [employer] for Duty Form to be completed by the provider and paid the form fee of $25.00. Scanned in to the system and gave to [place] for Matt to fill out.  Pt would like to be called once it has been completed.

## 2021-08-31 ENCOUNTER — Encounter: Payer: Self-pay | Admitting: Internal Medicine

## 2021-08-31 NOTE — Telephone Encounter (Signed)
Form completed by Catalina Antigua, PA and a copy sent to scan and sent to [facility].  Placed up front for pt to pick-up.

## 2021-09-05 NOTE — Telephone Encounter (Signed)
Pt came an picked the form up on 08/31/2021 he picked up from [facility].

## 2021-09-20 ENCOUNTER — Ambulatory Visit: Payer: Managed Care, Other (non HMO) | Admitting: Plastic Surgery

## 2021-09-21 ENCOUNTER — Telehealth: Payer: Self-pay | Admitting: Physician Assistant

## 2021-09-21 NOTE — Telephone Encounter (Signed)
error 

## 2021-09-24 ENCOUNTER — Ambulatory Visit (INDEPENDENT_AMBULATORY_CARE_PROVIDER_SITE_OTHER): Payer: PRIVATE HEALTH INSURANCE | Admitting: Medical

## 2021-09-24 ENCOUNTER — Encounter: Payer: Self-pay | Admitting: Medical

## 2021-09-24 ENCOUNTER — Encounter: Payer: Self-pay | Admitting: Physician Assistant

## 2021-09-24 VITALS — BP 110/70 | HR 87 | Wt 146.2 lb

## 2021-09-24 DIAGNOSIS — B351 Tinea unguium: Secondary | ICD-10-CM | POA: Diagnosis not present

## 2021-09-24 MED ORDER — FLUCONAZOLE 150 MG PO TABS: ORAL_TABLET | ORAL | 1 refills | Status: DC

## 2021-09-24 NOTE — Progress Notes (Signed)
Subjective:  Bryan Ruiz is a 46 y.o. male who presents for Chief Complaint  Patient presents with   Medication Refill    Toe nail fungus med      Here for toenail fungus.  Had similar a few years ago.  Didn't initially respond to Lamisil but did better with weekly antifungal per podiatry.   It cleared up at the time but the fungus is back again.  Left toenails only. No other aggravating or relieving factors.    No other c/o.  The following portions of the patient's history were reviewed and updated as appropriate: allergies, current medications, past family history, past medical history, past social history, past surgical history and problem list.  ROS Otherwise as in subjective above  Objective: BP 110/70   Pulse 87   Wt 146 lb 3.2 oz (66.3 kg)   SpO2 98%   BMI 21.59 kg/m   General appearance: alert, no distress, well developed, well nourished Feet neurovascularly intact Left toenails with yellowish coloration , thickening of left great  toe   Assessment: Encounter Diagnosis  Name Primary?   Onychomycosis Yes     Plan: Discussed differential, treatment options, risks/benefits of oral antifungal.  Begin medication below and f/u in 6- 8 week  Vash was seen today for medication refill.  Diagnoses and all orders for this visit:  Onychomycosis  Other orders -     fluconazole (DIFLUCAN) 150 MG tablet; TAKE 1 TABLET BY MOUTH ONE TIME PER WEEK    Follow up: 6 weeks

## 2021-09-27 ENCOUNTER — Ambulatory Visit (INDEPENDENT_AMBULATORY_CARE_PROVIDER_SITE_OTHER): Payer: Managed Care, Other (non HMO) | Admitting: Surgical

## 2021-09-27 DIAGNOSIS — D164 Benign neoplasm of bones of skull and face: Secondary | ICD-10-CM

## 2021-09-27 NOTE — Progress Notes (Signed)
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  Postoperatively he did report some lightheadedness and sensory changes, was encouraged to reach out to family medicine to discuss further.  He presents today, reports his main concern is sensory changes on the top of his forehead just above the incision.  He reports some sensitivity with wearing his motorcycle helmet.  He otherwise feels as if the area has healed well.  On exam forehead incision is intact, healing well.  No abnormal swelling noted.  Exam is reassuring.  We discussed that sensory changes are possible after excision of osteoma of the forehead, we discussed that sensory changes may improve over the next few weeks to months.  We discussed that he may have some ongoing sensory changes due to the surgery that may not improve with time.  All of his questions were answered to his content.  I recommend following up as needed and calling with questions or concerns.

## 2021-11-07 ENCOUNTER — Encounter: Payer: Self-pay | Admitting: Internal Medicine

## 2021-11-12 ENCOUNTER — Ambulatory Visit (INDEPENDENT_AMBULATORY_CARE_PROVIDER_SITE_OTHER): Payer: PRIVATE HEALTH INSURANCE | Admitting: Medical

## 2021-11-12 ENCOUNTER — Encounter: Payer: Self-pay | Admitting: Medical

## 2021-11-12 VITALS — BP 110/70 | HR 92 | Wt 151.0 lb

## 2021-11-12 DIAGNOSIS — Z79899 Other long term (current) drug therapy: Secondary | ICD-10-CM | POA: Diagnosis not present

## 2021-11-12 MED ORDER — FLUCONAZOLE 150 MG PO TABS: ORAL_TABLET | ORAL | 1 refills | Status: DC

## 2021-11-12 MED ORDER — NYSTATIN 100000 UNIT/GM EX POWD: 1.0000 | Freq: Three times a day (TID) | CUTANEOUS | 0 refills | Status: DC

## 2021-11-12 NOTE — Progress Notes (Signed)
Subjective:  Bryan Ruiz is a 46 y.o. male who presents for Chief Complaint  Patient presents with   1 month follow-up    1 month follow-up on toe fungus. Declines flu shot     Here for toenail fungus recheck. Started weekly fluconazole in late July.  Seeing a little improvement so far.   Had similar a few years ago.  Didn't initially respond to Lamisil but did better with weekly antifungal per podiatry.  Left toenails only. No other aggravating or relieving factors.    No other c/o.  The following portions of the patient's history were reviewed and updated as appropriate: allergies, current medications, past family history, past medical history, past social history, past surgical history and problem list.  ROS Otherwise as in subjective above    Objective: BP 110/70   Pulse 92   Wt 151 lb (68.5 kg)   BMI 22.30 kg/m   General appearance: alert, no distress, well developed, well nourished Feet neurovascularly intact Left toenails with yellowish coloration , great toenail looks ok, and there is some fresh new normal nail at base of the rest of the left toenails    Assessment: Encounter Diagnosis  Name Primary?   High risk medication use Yes     Plan: Lab today for surveillance on antifungal long term since late 09/2021.   Continue medicaiton below.  If not much improved in the next 4 weeks, consider change to Lamisil or other antifungal oral.  Discussed differential, treatment options, risks/benefits of oral antifungal.  F/u 6-8 weeks.  Bryan Ruiz was seen today for 1 month follow-up.  Diagnoses and all orders for this visit:  High risk medication use -     ALT  Other orders -     fluconazole (DIFLUCAN) 150 MG tablet; TAKE 1 TABLET BY MOUTH ONE TIME PER WEEK -     nystatin (MYCOSTATIN/NYSTOP) powder; Apply 1 Application topically 3 (three) times daily.   Follow up: 6 weeks

## 2021-11-13 LAB — ALT: ALT: 38 IU/L (ref 0–44)

## 2021-12-11 ENCOUNTER — Encounter: Payer: Self-pay | Admitting: Internal Medicine

## 2022-01-15 ENCOUNTER — Encounter: Payer: Self-pay | Admitting: Internal Medicine

## 2022-02-08 ENCOUNTER — Encounter: Payer: Self-pay | Admitting: Nurse Practitioner

## 2022-02-08 ENCOUNTER — Ambulatory Visit (INDEPENDENT_AMBULATORY_CARE_PROVIDER_SITE_OTHER): Payer: 59 | Admitting: Nurse Practitioner

## 2022-02-08 VITALS — BP 110/76 | HR 89 | Ht 69.0 in | Wt 149.0 lb

## 2022-02-08 DIAGNOSIS — Z8619 Personal history of other infectious and parasitic diseases: Secondary | ICD-10-CM

## 2022-02-08 DIAGNOSIS — M778 Other enthesopathies, not elsewhere classified: Secondary | ICD-10-CM

## 2022-02-08 MED ORDER — FLUCONAZOLE 150 MG PO TABS: ORAL_TABLET | ORAL | 1 refills | Status: DC

## 2022-02-08 MED ORDER — MELOXICAM 15 MG PO TABS: 15.0000 mg | ORAL_TABLET | Freq: Every day | ORAL | 0 refills | Status: DC

## 2022-02-08 NOTE — Progress Notes (Signed)
Orma Render, DNP, AGNP-c [facility] [address] [place], [place] [address] [phone]  Subjective:   Bryan Ruiz is a 46 y.o. male presents to day for evaluation of: Right foot pain Patient reports pain in the arch of the right foot that has been present for about a week.  He endorses pain with pressure to the area and walking.  He denies any known injury.  No redness, warmth, swelling. Toenail fungus Patient reports history of fungus to the toenails on both feet.  He was previously taking Diflucan orally as a treatment for this and it improved significantly however he ran out of the medication for the rash completely resolved.  He would like to restart on the Diflucan today to see if he can get full symptom resolution.  PMH, Medications, and Allergies reviewed and updated in chart as appropriate.   ROS negative except for what is listed in HPI. Objective:  BP 110/76 (BP Location: Right Arm, Patient Position: Sitting)   Pulse 89   Ht '5\' 9"'$  (1.753 m)   Wt 149 lb (67.6 kg)   SpO2 97%   BMI 22.00 kg/m  Physical Exam Vitals and nursing note reviewed.  Constitutional:      Appearance: Normal appearance.  HENT:     Head: Normocephalic.  Eyes:     Extraocular Movements: Extraocular movements intact.     Pupils: Pupils are equal, round, and reactive to light.  Neck:     Vascular: No carotid bruit.  Cardiovascular:     Rate and Rhythm: Normal rate and regular rhythm.     Pulses: Normal pulses.          Dorsalis pedis pulses are 2+ on the right side and 2+ on the left side.       Posterior tibial pulses are 2+ on the right side and 2+ on the left side.     Heart sounds: Normal heart sounds.  Pulmonary:     Effort: Pulmonary effort is normal.     Breath sounds: Normal breath sounds.  Abdominal:     General: Bowel sounds are normal. There is no distension.     Palpations: Abdomen is soft.     Tenderness: There is no abdominal tenderness. There is no  guarding.  Musculoskeletal:        General: Normal range of motion.     Cervical back: Normal range of motion.     Right lower leg: No edema.     Left lower leg: No edema.     Right foot: No deformity.     Left foot: Normal range of motion.  Feet:     Right foot:     Skin integrity: Skin integrity normal.     Toenail Condition: Fungal disease present.    Left foot:     Skin integrity: Skin integrity normal.     Toenail Condition: Fungal disease present.    Comments: Pain to the mid plantar region with extension of toes.  Tenderness is present on palpation. Lymphadenopathy:     Cervical: No cervical adenopathy.  Skin:    General: Skin is warm and dry.     Capillary Refill: Capillary refill takes less than 2 seconds.  Neurological:     General: No focal deficit present.     Mental Status: He is alert and oriented to person, place, and time.  Psychiatric:        Mood and Affect: Mood normal.        Behavior:  Behavior normal.        Thought Content: Thought content normal.        Judgment: Judgment normal.           Assessment & Plan:   Problem List Items Addressed This Visit     History of tinea cruris    Symptoms consistent with continued tinea cruris he has had improvement of symptoms with the use of oral Diflucan.  We will go ahead and refill this today.  Also recommend keeping the feet cool and dry, changing socks frequently, particularly if they become sweaty or wet.  Keep the toenails well trimmed.  Monitoring for new or worsening symptoms and follow-up as needed.      Relevant Medications   fluconazole (DIFLUCAN) 150 MG tablet   Extensor tendonitis of foot - Primary    Symptoms are consistent with extensor tendinitis.  Recommend arch support, gentle stretching exercises, ice, and meloxicam.  Discussed with the patient that may improve and return, particularly if supportive shoes or arch support is not utilized.  He may want to consider insoles with arch support.  If  symptoms are not improved with 2 weeks of conservative therapy recommend follow-up for orthopedic referral.      Relevant Medications   meloxicam (MOBIC) 15 MG tablet      Orma Render, DNP, AGNP-c 02/27/2022  2:54 PM    History, Medications, Surgery, SDOH, and Family History reviewed and updated as appropriate.

## 2022-02-08 NOTE — Patient Instructions (Signed)
It appears you have what is called extensor tendonitis. This is an inflammation of the tendon in the top of the foot that helps hold the toes to the rest of the foot and ankle. This can happen for multiple reasons, but most of the time it is repeated use or doing a certain action over and over (like climbing the ladder) that your body is not used to.   I want you to wear the wrap at least through the weekend and take the medication I have sent in to help with inflammation. You can ice the foot twice a day to see if this helps. You can continue to wear the wrap into next week if it feels like it helps.   If this does not feel better into next week or you cannot walk, please let us know.

## 2022-02-27 DIAGNOSIS — M778 Other enthesopathies, not elsewhere classified: Secondary | ICD-10-CM | POA: Insufficient documentation

## 2022-02-27 NOTE — Assessment & Plan Note (Signed)
Symptoms are consistent with extensor tendinitis.  Recommend arch support, gentle stretching exercises, ice, and meloxicam.  Discussed with the patient that may improve and return, particularly if supportive shoes or arch support is not utilized.  He may want to consider insoles with arch support.  If symptoms are not improved with 2 weeks of conservative therapy recommend follow-up for orthopedic referral.

## 2022-02-27 NOTE — Assessment & Plan Note (Signed)
Symptoms consistent with continued tinea cruris he has had improvement of symptoms with the use of oral Diflucan.  We will go ahead and refill this today.  Also recommend keeping the feet cool and dry, changing socks frequently, particularly if they become sweaty or wet.  Keep the toenails well trimmed.  Monitoring for new or worsening symptoms and follow-up as needed.

## 2022-03-14 ENCOUNTER — Ambulatory Visit (INDEPENDENT_AMBULATORY_CARE_PROVIDER_SITE_OTHER): Payer: 59 | Admitting: Medical

## 2022-03-14 ENCOUNTER — Encounter: Payer: Self-pay | Admitting: Medical

## 2022-03-14 VITALS — BP 108/66 | HR 99 | Temp 98.1°F | Resp 16 | Wt 145.8 lb

## 2022-03-14 DIAGNOSIS — R42 Dizziness and giddiness: Secondary | ICD-10-CM | POA: Diagnosis not present

## 2022-03-14 DIAGNOSIS — H6690 Otitis media, unspecified, unspecified ear: Secondary | ICD-10-CM | POA: Diagnosis not present

## 2022-03-14 DIAGNOSIS — J111 Influenza due to unidentified influenza virus with other respiratory manifestations: Secondary | ICD-10-CM | POA: Diagnosis not present

## 2022-03-14 MED ORDER — AMOXICILLIN 875 MG PO TABS: 875.0000 mg | ORAL_TABLET | Freq: Two times a day (BID) | ORAL | 0 refills | Status: AC

## 2022-03-14 NOTE — Progress Notes (Signed)
Subjective:  Bryan Ruiz is a 46 y.o. male who presents for Chief Complaint  Patient presents with   Dizziness    Complains of dizziness x 4 days. He was seen at an urgent care 3 days ago in [place] and was told that he had Influenza A. He was prescribed an antiviral and meclizine. He has started taking the antiviral, but still has dizziness. He is concerned that he has an ear infection.     Here for concerns of possible ear infection.  Initially had body aches, chills, fever started 4 days ago . Was seen at urgent care 3 days ago in [place].  Diagnosed with flu, started on several medications including Tamiflu, promethazine for nausea, meclizine for dizziness, and Afrin nasal spray.  He flew back home and since he has gotten back, his ears have been hurting and he is continue to have dizziness.  Overall still feels crummy.  No worse breathing or wheezing.  No fever currently.   No other aggravating or relieving factors.  No other c/o.  The following portions of the patient's history were reviewed and updated as appropriate: allergies, current medications, past family history, past medical history, past social history, past surgical history and problem list.  ROS Otherwise as in subjective above  Objective: BP 108/66   Pulse 99   Temp 98.1 F (36.7 C) (Oral)   Resp 16   Wt 145 lb 12.8 oz (66.1 kg)   SpO2 98% Comment: room air  BMI 21.53 kg/m   General appearance: alert, no distress, well developed, well nourished HEENT: normocephalic, sclerae anicteric, conjunctiva pink and moist, bilateral TMs with erythema, nares patent, no discharge, +erythema, pharynx normal Oral cavity: MMM, no lesions Neck: supple, no lymphadenopathy, no thyromegaly, no masses Heart: RRR, normal S1, S2, no murmurs Lungs: CTA bilaterally, no wheezes, rhonchi, or rales Pulses: 2+ radial pulses, 2+ pedal pulses, normal cap refill Ext: no edema   Assessment: Encounter Diagnoses  Name Primary?   Influenza  Yes   Acute otitis media, unspecified otitis media type    Dizziness      Plan: We discussed his symptoms, his recent prescription medications from urgent care.  He appears to have a secondary otitis media infection today on exam.  Begin amoxicillin as below, finish out Tamiflu, and advised all the other medicines were as needed use.  We discussed meclizine and promethazine and Afrin nasal spray, discussed risk and benefits and proper use of medication.  Advised to discontinue Afrin after 4 total days of use, only use promethazine as needed due to sedation.  Continue to hydrate well and rest.  If not much improved in the next 3 to 4 days let us know.  Bryan Ruiz was seen today for dizziness.  Diagnoses and all orders for this visit:  Influenza  Acute otitis media, unspecified otitis media type  Dizziness  Other orders -     amoxicillin (AMOXIL) 875 MG tablet; Take 1 tablet (875 mg total) by mouth 2 (two) times daily for 10 days.    Follow up: prn

## 2022-07-05 ENCOUNTER — Encounter: Payer: PRIVATE HEALTH INSURANCE | Admitting: Physician Assistant

## 2022-07-16 ENCOUNTER — Encounter: Payer: Self-pay | Admitting: Nurse Practitioner

## 2022-07-16 ENCOUNTER — Ambulatory Visit: Payer: 59 | Admitting: Nurse Practitioner

## 2022-07-16 VITALS — BP 124/82 | HR 91 | Wt 152.4 lb

## 2022-07-16 DIAGNOSIS — B351 Tinea unguium: Secondary | ICD-10-CM

## 2022-07-16 DIAGNOSIS — Z1211 Encounter for screening for malignant neoplasm of colon: Secondary | ICD-10-CM

## 2022-07-16 DIAGNOSIS — S4991XA Unspecified injury of right shoulder and upper arm, initial encounter: Secondary | ICD-10-CM

## 2022-07-16 MED ORDER — DICLOFENAC SODIUM 75 MG PO TBEC
75.0000 mg | DELAYED_RELEASE_TABLET | Freq: Two times a day (BID) | ORAL | 3 refills | Status: DC | PRN
Start: 2022-07-16 — End: 2023-09-18

## 2022-07-16 MED ORDER — FLUCONAZOLE 150 MG PO TABS
ORAL_TABLET | ORAL | 2 refills | Status: DC
Start: 2022-07-16 — End: 2023-01-28

## 2022-07-16 NOTE — Patient Instructions (Signed)
I have sent in diclofenac for pain for you. You can take this up to twice a day as needed for pain.   I have sent the referral to orthopedics for the shoulder and to GI for the colonoscopy.   I have also sent in a refill of the antifungal medication for you. Hopefully this will get rid of this for good.   If you have any concerns, please let me know.

## 2022-07-16 NOTE — Progress Notes (Signed)
Shawna Clamp, DNP, AGNP-c [facility]  [address] [place], [place] [address] [phone]  ESTABLISHED PATIENT- Chronic Health and/or Follow-Up Visit  Blood pressure 124/82, pulse 91, weight 152 lb 6.4 oz (69.1 kg).    Bryan Ruiz is a 47 y.o. year old male presenting today for evaluation and management of chronic conditions.   Bryan Ruiz presents today with a chief complaint of persistent right shoulder pain, which he has been experiencing for approximately two months. He initially thought it was a pulled muscle. The pain is exacerbated when lying on it and during stretches at work. He describes the pain as localized at the top area of his shoulder and notes it particularly hurts when twisting his arm in a specific way. He also reports occasional weakness when lifting objects, though this is not consistent.  Additionally, he discusses an ongoing issue with a toenail condition, which he describes as recurrent despite treatment. He has been prescribed a medication that he takes weekly, which temporarily resolves the symptoms until he runs out of the medication. The problem tends to reappear every few months, affecting different toes over time. He expresses frustration over the recurrent nature of this condition and the need for continual medication. He also mentions that he is the only one in his household suffering from this condition and speculates about potential causes, including environmental factors.  He is interested in colonoscopy referral for screening.   All ROS negative with exception of what is listed above.   PHYSICAL EXAM Physical Exam Vitals and nursing note reviewed.  Constitutional:      Appearance: Normal appearance.  HENT:     Head: Normocephalic.  Eyes:     Conjunctiva/sclera: Conjunctivae normal.  Cardiovascular:     Rate and Rhythm: Normal rate and regular rhythm.     Pulses: Normal pulses.     Heart sounds: Normal heart sounds.   Pulmonary:     Effort: Pulmonary effort is normal.     Breath sounds: Normal breath sounds.  Musculoskeletal:        General: Tenderness present.     Right shoulder: Decreased range of motion. Decreased strength.     Left shoulder: Normal.     Cervical back: Normal range of motion. No tenderness.  Feet:     Right foot:     Toenail Condition: Fungal disease present.    Left foot:     Toenail Condition: Fungal disease present. Skin:    General: Skin is warm and dry.  Neurological:     General: No focal deficit present.     Mental Status: He is alert.     Sensory: No sensory deficit.     Motor: Weakness present.  Psychiatric:        Mood and Affect: Mood normal.     PLAN Problem List Items Addressed This Visit     Onychomycosis    The patient reports recurrent episodes of toenail fungal infection, which temporarily resolves with antifungal medication but reoccurs. The patient has been using a weekly oral antifungal regimen, which has been effective but requires continuous use to maintain results.  Plan: - Will prescribe an antifungal medication for a three-month supply with refills. The patient will take one tablet weekly, totaling 12 tablets, to potentially extend the period of remission of the fungal infection.      Relevant Medications   fluconazole (DIFLUCAN) 150 MG tablet   Injury of right shoulder - Primary    The patient presents with shoulder pain persisting  for two months, suggestive of an overuse injury, possibly a rotator cuff tear or impingement. The pain is exacerbated by abduction, pressure, and rotation. There is occasional weakness noted during lifting activities.  Plan: - Will refer the patient to orthopedics for further evaluation, including potential ultrasound or more advanced imaging to ascertain the nature of the injury. - Prescribe Diclofenac, to be taken up to twice daily, to manage pain and inflammation.      Relevant Medications   diclofenac  (VOLTAREN) 75 MG EC tablet   Other Relevant Orders   Ambulatory referral to Orthopedic Surgery   Other Visit Diagnoses     Screening for colon cancer       Relevant Orders   Ambulatory referral to Gastroenterology       Return if symptoms worsen or fail to improve.   Shawna Clamp, DNP, AGNP-c 07/16/2022 11:46 AM

## 2022-07-24 ENCOUNTER — Ambulatory Visit (HOSPITAL_BASED_OUTPATIENT_CLINIC_OR_DEPARTMENT_OTHER): Payer: Managed Care, Other (non HMO) | Admitting: Orthopaedic Surgery

## 2022-07-24 DIAGNOSIS — M25511 Pain in right shoulder: Secondary | ICD-10-CM | POA: Diagnosis not present

## 2022-07-24 DIAGNOSIS — M7501 Adhesive capsulitis of right shoulder: Secondary | ICD-10-CM

## 2022-07-24 MED ORDER — LIDOCAINE HCL 1 % IJ SOLN
4.0000 mL | INTRAMUSCULAR | Status: AC | PRN
Start: 2022-07-24 — End: 2022-07-24
  Administered 2022-07-24: 4 mL

## 2022-07-24 MED ORDER — TRIAMCINOLONE ACETONIDE 40 MG/ML IJ SUSP
80.0000 mg | INTRAMUSCULAR | Status: AC | PRN
Start: 2022-07-24 — End: 2022-07-24
  Administered 2022-07-24: 80 mg via INTRA_ARTICULAR

## 2022-07-24 NOTE — Progress Notes (Signed)
Chief Complaint: Right shoulder pain     History of Present Illness:    Bryan Ruiz is a 47 y.o. male right-hand-dominant male presents with ongoing right shoulder pain that has been worse over the last several months.  He states that this did not have any injury.  He has had progressive difficulty with range of motion.  He does feel sharp stabbing type pain.  He has difficulty going behind the back    Surgical History:   None  PMH/PSH/Family History/Social History/Meds/Allergies:    Past Medical History:  Diagnosis Date  . History of tinea cruris   . History of vertigo 2009   once prior as of 11/2014   . Onychomycosis    Past Surgical History:  Procedure Laterality Date  . ANKLE SURGERY    . EXCISION MASS HEAD N/A 08/24/2021   Procedure: EXCISION MASS FOREHEAD;  Surgeon: Allena Napoleon, MD;  Location: [facility];  Service: Plastics;  Laterality: N/A;  . VASECTOMY     Social History   Socioeconomic History  . Marital status: Single    Spouse name: Not on file  . Number of children: Not on file  . Years of education: Not on file  . Highest education level: Not on file  Occupational History  . Not on file  Tobacco Use  . Smoking status: Never  . Smokeless tobacco: Never  Vaping Use  . Vaping Use: Never used  Substance and Sexual Activity  . Alcohol use: No  . Drug use: No  . Sexual activity: Yes    Partners: Female  Other Topics Concern  . Not on file  Social History Narrative  . Not on file   Social Determinants of Health   Financial Resource Strain: Not on file  Food Insecurity: Not on file  Transportation Needs: Not on file  Physical Activity: Not on file  Stress: Not on file  Social Connections: Not on file   Family History  Problem Relation Age of Onset  . Diabetes Mother   . Hypertension Mother   . Hyperlipidemia Mother   . Prostate cancer Father 5       surgery   . Hypertension Father    . Hyperlipidemia Father   . Diabetes Maternal Grandmother   . Kidney disease Maternal Grandmother   . CVA Neg Hx    No Known Allergies Current Outpatient Medications  Medication Sig Dispense Refill  . diclofenac (VOLTAREN) 75 MG EC tablet Take 1 tablet (75 mg total) by mouth 2 (two) times daily as needed. 60 tablet 3  . fluconazole (DIFLUCAN) 150 MG tablet TAKE 1 TABLET BY MOUTH ONE TIME PER WEEK 12 tablet 2  . Meclizine HCl 25 MG CHEW Chew 1 tablet by mouth 3 (three) times daily.    . meloxicam (MOBIC) 15 MG tablet Take 1 tablet (15 mg total) by mouth daily. (Patient not taking: Reported on 03/14/2022) 30 tablet 0  . nystatin (MYCOSTATIN/NYSTOP) powder Apply 1 Application topically 3 (three) times daily. (Patient not taking: Reported on 03/14/2022) 15 g 0  . oseltamivir (TAMIFLU) 75 MG capsule Take 75 mg by mouth 2 (two) times daily. (Patient not taking: Reported on 07/16/2022)     No current facility-administered medications for this visit.   No results found.  Review of Systems:  A ROS was performed including pertinent positives and negatives as documented in the HPI.  Physical Exam :   Constitutional: NAD and appears stated age Neurological: Alert and oriented Psych: Appropriate affect and cooperative There were no vitals taken for this visit.   Comprehensive Musculoskeletal Exam:    Tenderness palpation about the right glenohumeral joint.  Forward elevation is to 120 degrees with pain.  External rotation at the side is to 30 degrees with pain with passive external rotation.  Internal rotation is to back pocket  Imaging:    I personally reviewed and interpreted the radiographs.   Assessment:   47 y.o. male with right shoulder pain consistent with adhesive capsulitis.  At today's visit I did recommend ultrasound-guided injection.  Will plan for return to clinic in 2 weeks for reassessment and possible additional injection time  Plan :    - Right shoulder ultrasound-guided  injection performed and verbal consent obtained    Procedure Note  Patient: Bryan Ruiz             Date of Birth: 1975-07-29           MRN: [id number]             Visit Date: 07/24/2022  Procedures: Visit Diagnoses: No diagnosis found.  Large Joint Inj: R glenohumeral on 07/24/2022 4:40 PM Indications: pain Details: 22 G 1.5 in needle, ultrasound-guided anterior approach  Arthrogram: No  Medications: 4 mL lidocaine 1 %; 80 mg triamcinolone acetonide 40 MG/ML Outcome: tolerated well, no immediate complications Procedure, treatment alternatives, risks and benefits explained, specific risks discussed. Consent was given by the patient. Immediately prior to procedure a time out was called to verify the correct patient, procedure, equipment, support staff and site/side marked as required. Patient was prepped and draped in the usual sterile fashion.          I personally saw and evaluated the patient, and participated in the management and treatment plan.  Huel Cote, MD Attending Physician, Orthopedic Surgery  This document was dictated using Dragon voice recognition software. A reasonable attempt at proof reading has been made to minimize errors.

## 2022-07-26 ENCOUNTER — Encounter: Payer: Self-pay | Admitting: Gastroenterology

## 2022-08-02 ENCOUNTER — Encounter: Payer: Self-pay | Admitting: Nurse Practitioner

## 2022-08-02 DIAGNOSIS — S4991XA Unspecified injury of right shoulder and upper arm, initial encounter: Secondary | ICD-10-CM | POA: Insufficient documentation

## 2022-08-02 NOTE — Assessment & Plan Note (Signed)
The patient reports recurrent episodes of toenail fungal infection, which temporarily resolves with antifungal medication but reoccurs. The patient has been using a weekly oral antifungal regimen, which has been effective but requires continuous use to maintain results.  Plan: - Will prescribe an antifungal medication for a three-month supply with refills. The patient will take one tablet weekly, totaling 12 tablets, to potentially extend the period of remission of the fungal infection.

## 2022-08-02 NOTE — Assessment & Plan Note (Signed)
The patient presents with shoulder pain persisting for two months, suggestive of an overuse injury, possibly a rotator cuff tear or impingement. The pain is exacerbated by abduction, pressure, and rotation. There is occasional weakness noted during lifting activities.  Plan: - Will refer the patient to orthopedics for further evaluation, including potential ultrasound or more advanced imaging to ascertain the nature of the injury. - Prescribe Diclofenac, to be taken up to twice daily, to manage pain and inflammation.

## 2022-08-09 ENCOUNTER — Ambulatory Visit (HOSPITAL_BASED_OUTPATIENT_CLINIC_OR_DEPARTMENT_OTHER): Payer: Managed Care, Other (non HMO) | Admitting: Orthopaedic Surgery

## 2022-08-28 ENCOUNTER — Ambulatory Visit (AMBULATORY_SURGERY_CENTER): Payer: Managed Care, Other (non HMO) | Admitting: *Deleted

## 2022-08-28 VITALS — Ht 69.0 in | Wt 150.0 lb

## 2022-08-28 DIAGNOSIS — Z1211 Encounter for screening for malignant neoplasm of colon: Secondary | ICD-10-CM

## 2022-08-28 MED ORDER — NA SULFATE-K SULFATE-MG SULF 17.5-3.13-1.6 GM/177ML PO SOLN
1.0000 | Freq: Once | ORAL | 0 refills | Status: AC
Start: 2022-08-28 — End: 2022-08-28

## 2022-08-28 NOTE — Progress Notes (Signed)

## 2022-09-10 ENCOUNTER — Encounter: Payer: Self-pay | Admitting: Gastroenterology

## 2022-09-11 ENCOUNTER — Encounter: Payer: Self-pay | Admitting: Certified Registered Nurse Anesthetist

## 2022-09-16 ENCOUNTER — Encounter: Payer: Self-pay | Admitting: Gastroenterology

## 2022-09-16 ENCOUNTER — Ambulatory Visit: Payer: Managed Care, Other (non HMO) | Admitting: Gastroenterology

## 2022-09-16 VITALS — BP 97/62 | HR 71 | Temp 97.1°F | Resp 12 | Ht 69.0 in | Wt 150.0 lb

## 2022-09-16 DIAGNOSIS — Z1211 Encounter for screening for malignant neoplasm of colon: Secondary | ICD-10-CM | POA: Diagnosis present

## 2022-09-16 DIAGNOSIS — D123 Benign neoplasm of transverse colon: Secondary | ICD-10-CM

## 2022-09-16 MED ORDER — SODIUM CHLORIDE 0.9 % IV SOLN: 500.0000 mL | Freq: Once | INTRAVENOUS | Status: DC

## 2022-09-16 NOTE — Progress Notes (Signed)
 Called to room to assist during endoscopic procedure.  Patient ID and intended procedure confirmed with present staff. Received instructions for my participation in the procedure from the performing physician.  

## 2022-09-16 NOTE — Progress Notes (Signed)
History and Physical:  This patient presents for endoscopic testing for: Encounter Diagnosis  Name Primary?   Special screening for malignant neoplasms, colon Yes    Average risk for colorectal cancer.  First screening exam.  Patient denies chronic abdominal pain, rectal bleeding, constipation or diarrhea.    Patient is otherwise without complaints or active issues today.   Past Medical History: Past Medical History:  Diagnosis Date   History of tinea cruris    History of vertigo 2009   once prior as of 11/2014    Onychomycosis      Past Surgical History: Past Surgical History:  Procedure Laterality Date   ANKLE SURGERY     EXCISION MASS HEAD N/A 08/24/2021   Procedure: EXCISION MASS FOREHEAD;  Surgeon: Allena Napoleon, MD;  Location: [facility];  Service: Plastics;  Laterality: N/A;   VASECTOMY      Allergies: No Known Allergies  Outpatient Meds: Current Outpatient Medications  Medication Sig Dispense Refill   diclofenac (VOLTAREN) 75 MG EC tablet Take 1 tablet (75 mg total) by mouth 2 (two) times daily as needed. (Patient not taking: Reported on 08/28/2022) 60 tablet 3   fluconazole (DIFLUCAN) 150 MG tablet TAKE 1 TABLET BY MOUTH ONE TIME PER WEEK 12 tablet 2   Meclizine HCl 25 MG CHEW Chew 1 tablet by mouth 3 (three) times daily. (Patient not taking: Reported on 08/28/2022)     meloxicam (MOBIC) 15 MG tablet Take 1 tablet (15 mg total) by mouth daily. 30 tablet 0   Multiple Vitamin (MULTIVITAMIN PO) Take by mouth daily.     nystatin (MYCOSTATIN/NYSTOP) powder Apply 1 Application topically 3 (three) times daily. 15 g 0   Current Facility-Administered Medications  Medication Dose Route Frequency Provider Last Rate Last Admin   0.9 %  sodium chloride infusion  500 mL Intravenous Once Danis, Starr Lake III, MD          ___________________________________________________________________ Objective   Exam:  BP (!) 102/58   Pulse 94   Temp (!) 97.1 F  (36.2 C) (Temporal)   Ht 5\' 9"  (1.753 m)   Wt 150 lb (68 kg)   SpO2 97%   BMI 22.15 kg/m   CV: regular , S1/S2 Resp: clear to auscultation bilaterally, normal RR and effort noted GI: soft, no tenderness, with active bowel sounds.   Assessment: Encounter Diagnosis  Name Primary?   Special screening for malignant neoplasms, colon Yes     Plan: Colonoscopy   The benefits and risks of the planned procedure were described in detail with the patient or (when appropriate) their health care proxy.  Risks were outlined as including, but not limited to, bleeding, infection, perforation, adverse medication reaction leading to cardiac or pulmonary decompensation, pancreatitis (if ERCP).  The limitation of incomplete mucosal visualization was also discussed.  No guarantees or warranties were given.  The patient is appropriate for an endoscopic procedure in the ambulatory setting.   - Amada Jupiter, MD

## 2022-09-16 NOTE — Op Note (Signed)
[facility] Patient Name: Bryan Ruiz Procedure Date: 09/16/2022 9:07 AM MRN: [id number] Endoscopist: Sherilyn Cooter L. Myrtie Neither , MD, [id number] Age: 47 Referring MD:  Date of Birth: 17-Aug-1975 Gender: Male Account #: [id number] Procedure:                Colonoscopy Indications:              Screening for colorectal malignant neoplasm, This                            is the patient's first colonoscopy Medicines:                Monitored Anesthesia Care Procedure:                Pre-Anesthesia Assessment:                           - Prior to the procedure, a History and Physical                            was performed, and patient medications and                            allergies were reviewed. The patient's tolerance of                            previous anesthesia was also reviewed. The risks                            and benefits of the procedure and the sedation                            options and risks were discussed with the patient.                            All questions were answered, and informed consent                            was obtained. Prior Anticoagulants: The patient has                            taken no anticoagulant or antiplatelet agents. ASA                            Grade Assessment: I - A normal, healthy patient.                            After reviewing the risks and benefits, the patient                            was deemed in satisfactory condition to undergo the                            procedure.  After obtaining informed consent, the colonoscope                            was passed under direct vision. Throughout the                            procedure, the patient's blood pressure, pulse, and                            oxygen saturations were monitored continuously. The                            CF [id number] #[id number] was introduced through the anus                            and advanced to the the cecum,  identified by                            appendiceal orifice and ileocecal valve. The                            colonoscopy was somewhat difficult due to a                            redundant colon and significant looping. Successful                            completion of the procedure was aided by using                            manual pressure and straightening and shortening                            the scope to obtain bowel loop reduction. The                            patient tolerated the procedure well. The quality                            of the bowel preparation was good (some scattered                            fibrous debris in the right colon). The ileocecal                            valve, appendiceal orifice, and rectum were                            photographed. The bowel preparation used was SUPREP. Scope In: 9:20:32 AM Scope Out: 9:38:20 AM Scope Withdrawal Time: 0 hours 12 minutes 38 seconds  Total Procedure Duration: 0 hours 17 minutes 48 seconds  Findings:                 The perianal and  digital rectal examinations were                            normal.                           Repeat examination of right colon under NBI                            performed.                           A 12 mm polyp was found in the distal transverse                            colon. The polyp was pedunculated. The polyp was                            removed with a hot snare. Resection and retrieval                            were complete.                           The sigmoid colon was redundant.                           The exam was otherwise without abnormality on                            direct and retroflexion views. Complications:            No immediate complications. Estimated Blood Loss:     Estimated blood loss: none. Impression:               - One 12 mm polyp in the distal transverse colon,                            removed with a hot snare. Resected  and retrieved.                           - Redundant colon.                           - The examination was otherwise normal on direct                            and retroflexion views. Recommendation:           - Patient has a contact number available for                            emergencies. The signs and symptoms of potential                            delayed complications were discussed with the  patient. Return to normal activities tomorrow.                            Written discharge instructions were provided to the                            patient.                           - Resume previous diet.                           - Continue present medications.                           - Await pathology results.                           - Repeat colonoscopy is recommended for                            surveillance. The colonoscopy date will be                            determined after pathology results from today's                            exam become available for review. Johanne Mcglade L. Myrtie Neither, MD 09/16/2022 9:42:26 AM This report has been signed electronically.

## 2022-09-16 NOTE — Patient Instructions (Signed)
 Handout on polyps given to patient. Await pathology results. Resume previous diet and continue present medications.  Repeat colonoscopy for surveillance will be determined based off of pathology results.   YOU HAD AN ENDOSCOPIC PROCEDURE TODAY AT [facility]:   Refer to the procedure report that was given to you for any specific questions about what was found during the examination.  If the procedure report does not answer your questions, please call your gastroenterologist to clarify.  If you requested that your care partner not be given the details of your procedure findings, then the procedure report has been included in a sealed envelope for you to review at your convenience later.  YOU SHOULD EXPECT: Some feelings of bloating in the abdomen. Passage of more gas than usual.  Walking can help get rid of the air that was put into your GI tract during the procedure and reduce the bloating. If you had a lower endoscopy (such as a colonoscopy or flexible sigmoidoscopy) you may notice spotting of blood in your stool or on the toilet paper. If you underwent a bowel prep for your procedure, you may not have a normal bowel movement for a few days.  Please Note:  You might notice some irritation and congestion in your nose or some drainage.  This is from the oxygen used during your procedure.  There is no need for concern and it should clear up in a day or so.  SYMPTOMS TO REPORT IMMEDIATELY:  Following lower endoscopy (colonoscopy or flexible sigmoidoscopy):  Excessive amounts of blood in the stool  Significant tenderness or worsening of abdominal pains  Swelling of the abdomen that is new, acute  Fever of 100F or higher  For urgent or emergent issues, a gastroenterologist can be reached at any hour by calling [phone]. Do not use MyChart messaging for urgent concerns.    DIET:  We do recommend a small meal at first, but then you may proceed to your regular diet.  Drink  plenty of fluids but you should avoid alcoholic beverages for 24 hours.  ACTIVITY:  You should plan to take it easy for the rest of today and you should NOT DRIVE or use heavy machinery until tomorrow (because of the sedation medicines used during the test).    FOLLOW UP: Our staff will call the number listed on your records the next business day following your procedure.  We will call around 7:15- 8:00 am to check on you and address any questions or concerns that you may have regarding the information given to you following your procedure. If we do not reach you, we will leave a message.     If any biopsies were taken you will be contacted by phone or by letter within the next 1-3 weeks.  Please call us at [phone] if you have not heard about the biopsies in 3 weeks.    SIGNATURES/CONFIDENTIALITY: You and/or your care partner have signed paperwork which will be entered into your electronic medical record.  These signatures attest to the fact that that the information above on your After Visit Summary has been reviewed and is understood.  Full responsibility of the confidentiality of this discharge information lies with you and/or your care-partner.

## 2022-09-16 NOTE — Progress Notes (Signed)
 Pt's states no medical or surgical changes since previsit or office visit. 

## 2022-09-16 NOTE — Progress Notes (Signed)
 Report given to PACU, vss 

## 2022-09-17 ENCOUNTER — Telehealth: Payer: Self-pay

## 2022-09-17 NOTE — Telephone Encounter (Signed)
  Follow up Call-     09/16/2022    8:27 AM  Call back number  Post procedure Call Back phone  # [phone]  Permission to leave phone message Yes     Patient questions:  Do you have a fever, pain , or abdominal swelling? No. Pain Score  0 *  Have you tolerated food without any problems? Yes.    Have you been able to return to your normal activities? Yes.    Do you have any questions about your discharge instructions: Diet   No. Medications  No. Follow up visit  No.  Do you have questions or concerns about your Care? No.  Actions: * If pain score is 4 or above: No action needed, pain <4.

## 2022-09-24 ENCOUNTER — Encounter: Payer: Self-pay | Admitting: Gastroenterology

## 2023-01-23 ENCOUNTER — Telehealth: Payer: Self-pay | Admitting: Nurse Practitioner

## 2023-01-23 DIAGNOSIS — B351 Tinea unguium: Secondary | ICD-10-CM

## 2023-01-23 NOTE — Telephone Encounter (Signed)
Pt called and is refill for his diflucan please send to the [id number] - [place], [place] - [address]

## 2023-01-28 MED ORDER — FLUCONAZOLE 150 MG PO TABS
ORAL_TABLET | ORAL | 2 refills | Status: DC
Start: 2023-01-28 — End: 2023-10-14

## 2023-07-16 ENCOUNTER — Ambulatory Visit: Payer: Self-pay

## 2023-07-16 NOTE — Telephone Encounter (Signed)
  Chief Complaint: chest pain Symptoms: pain Frequency: constant Pertinent Negatives: Patient denies difficulty breathing, difficulty swallowing Disposition: [x] ED /[] Urgent Care (no appt availability in office) / [] Appointment(In office/virtual)/ []  [facility]/ [] Home Care/ [] Refused Recommended Disposition /[] [facility]/ []  Follow-up with PCP Additional Notes:  Starts conversation by saying "I'm feeling better while on hold", 'I just ate something and I am able to swallow" pain still persists. Last night while eat cube steak he felt the sensation that it was stuck. Pain in center of chest. He took tums without relief. Still experiencing pain, feels like steak dislodged when he just ate, chest pain still present. He is requesting office visit. Advised ER for chest pain. Patient in agreement and will proceed to ER.   Copied from CRM [id number]. Topic: Clinical - Red Word Triage >> Jul 16, 2023 12:53 PM Hobson Luna F wrote: Red Word that prompted transfer to Nurse Triage: Pain in chest, food potentially stuck. Patient says when he was eating dinner last night he doesn't believe his food went down all the way. Patient says he has been experiencing pain in his chest but it has gotten better. Patient called for an appointment to get his chest examined. Patient says he is breathing fine. Reason for Disposition  [1] Chest pain lasts > 5 minutes AND [2] occurred in past 3 days (72 hours) (Exception: Feels exactly the same as previously diagnosed heartburn and has accompanying sour taste in mouth.)  Symptoms of food or bone stuck in throat or esophagus (e.g., pain in throat or chest, FB sensation, blood-tinged saliva)  Protocols used: Chest Pain-A-AH, Swallowing Difficulty-A-AH

## 2023-07-17 ENCOUNTER — Emergency Department
Admission: EM | Admit: 2023-07-17 | Discharge: 2023-07-17 | Disposition: A | Discharge status: Home / Self Care | Attending: Emergency Medicine | Admitting: Emergency Medicine

## 2023-07-17 ENCOUNTER — Encounter: Payer: Self-pay | Admitting: Emergency Medicine

## 2023-07-17 ENCOUNTER — Emergency Department

## 2023-07-17 ENCOUNTER — Other Ambulatory Visit: Payer: Self-pay

## 2023-07-17 DIAGNOSIS — R0789 Other chest pain: Secondary | ICD-10-CM | POA: Diagnosis present

## 2023-07-17 LAB — BASIC METABOLIC PANEL WITH GFR
Anion gap: 9 (ref 5–15)
BUN: 13 mg/dL (ref 6–20)
CO2: 28 mmol/L (ref 22–32)
Calcium: 9.2 mg/dL (ref 8.9–10.3)
Chloride: 103 mmol/L (ref 98–111)
Creatinine, Ser: 1.08 mg/dL (ref 0.61–1.24)
GFR, Estimated: >60 mL/min (ref >60)
Glucose, Bld: 69 mg/dL — ABNORMAL LOW (ref 70–99)
Potassium: 3.9 mmol/L (ref 3.5–5.1)
Sodium: 140 mmol/L (ref 135–145)

## 2023-07-17 LAB — CBC
HCT: 40.3 % (ref 39.0–52.0)
Hemoglobin: 14 g/dL (ref 13.0–17.0)
MCH: 30.1 pg (ref 26.0–34.0)
MCHC: 34.7 g/dL (ref 30.0–36.0)
MCV: 86.7 fL (ref 80.0–100.0)
Platelets: 185 10*3/uL (ref 150–400)
RBC: 4.65 MIL/uL (ref 4.22–5.81)
RDW: 11.3 % — ABNORMAL LOW (ref 11.5–15.5)
WBC: 4.3 10*3/uL (ref 4.0–10.5)
nRBC: 0 % (ref 0.0–0.2)

## 2023-07-17 LAB — TROPONIN I (HIGH SENSITIVITY): Troponin I (High Sensitivity): <2 ng/L (ref <18)

## 2023-07-17 MED ORDER — KETOROLAC TROMETHAMINE 30 MG/ML IJ SOLN
30.0000 mg | Freq: Once | INTRAMUSCULAR | Status: AC
Start: 2023-07-17 — End: 2023-07-17
  Administered 2023-07-17: 30 mg via INTRAMUSCULAR
  Filled 2023-07-17: qty 1

## 2023-07-17 NOTE — ED Provider Notes (Signed)
 [facility] Provider Note    Event Date/Time   First MD Initiated Contact with Patient 07/17/23 1340     (approximate)   History   Chest Pain   HPI  Bryan Ruiz is a 48 y.o. male who presents to the ED for evaluation of Chest Pain   Review of PCP visit from 1 year ago.  Generally fairly healthy.  Patient presents to the ED for evaluation of about 24-36 hours of chest discomfort.  Worse when moving around, extending his arms or leaning back.  Worse with palpation.  No dizziness or syncope, shortness of breath, fever.  No history of cardiac disease   Physical Exam   Triage Vital Signs: ED Triage Vitals  Encounter Vitals Group     BP 07/17/23 1314 107/70     Systolic BP Percentile --      Diastolic BP Percentile --      Pulse Rate 07/17/23 1314 92     Resp 07/17/23 1314 17     Temp 07/17/23 1314 98.1 F (36.7 C)     Temp src --      SpO2 07/17/23 1314 97 %     Weight 07/17/23 1313 156 lb (70.8 kg)     Height 07/17/23 1313 5\' 10"  (1.778 m)     Head Circumference --      Peak Flow --      Pain Score 07/17/23 1313 5     Pain Loc --      Pain Education --      Exclude from Growth Chart --     Most recent vital signs: Vitals:   07/17/23 1314  BP: 107/70  Pulse: 92  Resp: 17  Temp: 98.1 F (36.7 C)  SpO2: 97%    General: Awake, no distress.  CV:  Good peripheral perfusion.  Resp:  Normal effort.  Abd:  No distention.  MSK:  No deformity noted.  Reproducible chest pain on palpation without overlying skin changes, rash  Neuro:  No focal deficits appreciated. Other:     ED Results / Procedures / Treatments   Labs (all labs ordered are listed, but only abnormal results are displayed) Labs Reviewed  BASIC METABOLIC PANEL WITH GFR - Abnormal; Notable for the following components:      Result Value   Glucose, Bld 69 (*)    All other components within normal limits  CBC - Abnormal; Notable for the following components:   RDW  11.3 (*)    All other components within normal limits  TROPONIN I (HIGH SENSITIVITY)  TROPONIN I (HIGH SENSITIVITY)    EKG Sinus rhythm with a rate of 97 bpm.  Normal axis and intervals.  Incomplete right bundle.  No clear signs of acute ischemia.  RADIOLOGY CXR interpreted by me without evidence of acute cardiopulmonary pathology.  Official radiology report(s): DG Chest 2 View Result Date: 07/17/2023 CLINICAL DATA:  Chest pain since yesterday. EXAM: CHEST - 2 VIEW COMPARISON:  October 17, 2015 FINDINGS: The heart size and mediastinal contours are within normal limits. Both lungs are clear. The visualized skeletal structures are unremarkable. IMPRESSION: No active cardiopulmonary disease. Electronically Signed   By: Rosalene Colon M.D.   On: 07/17/2023 13:37    PROCEDURES and INTERVENTIONS:  .1-3 Lead EKG Interpretation  Performed by: Arline Bennett, MD Authorized by: Arline Bennett, MD     Interpretation: normal     ECG rate:  88   ECG rate assessment: normal  Rhythm: sinus rhythm     Ectopy: none     Conduction: normal     Medications  ketorolac (TORADOL) 30 MG/ML injection 30 mg (30 mg Intramuscular Given 07/17/23 1414)     IMPRESSION / MDM / ASSESSMENT AND PLAN / ED COURSE  I reviewed the triage vital signs and the nursing notes.  Differential diagnosis includes, but is not limited to, ACS, PTX, PNA, muscle strain/spasm, PE, dissection, anxiety, pleural effusion  {Patient presents with symptoms of an acute illness or injury that is potentially life-threatening.  Low/moderate risk patient presents with atypical discomfort of likely MSK etiology.  EKG is nonischemic and CXR is clear.  Will obtain basic labs and troponins, provide Toradol for relief and reassess.  Reassuring CBC and metabolic panel.  Mildly low glucose is noted, he is tolerating p.o. without difficulty to address this.  Improving pain with the Toradol and awaiting troponin at the time of signout to  oncoming physician.  If this is negative I suspect patient will be suitable for outpatient management.  Considering the fairly low risk and chronicity of patient's symptoms would not think he would need serial troponins  Clinical Course as of 07/17/23 1458  Thu Jul 17, 2023  1457 Reassessed.  Improving pain after the Toradol.  Awaiting troponins.  Discussed plan of care.  He is agreeable. [DS]    Clinical Course User Index [DS] Arline Bennett, MD     FINAL CLINICAL IMPRESSION(S) / ED DIAGNOSES   Final diagnoses:  Other chest pain     Rx / DC Orders   ED Discharge Orders     None        Note:  This document was prepared using Dragon voice recognition software and may include unintentional dictation errors.   Arline Bennett, MD 07/17/23 [phone]

## 2023-07-17 NOTE — Discharge Instructions (Addendum)
Please take Tylenol and ibuprofen/Advil for your pain.  It is safe to take them together, or to alternate them every few hours.  Take up to 1000mg of Tylenol at a time, up to 4 times per day.  Do not take more than 4000 mg of Tylenol in 24 hours.  For ibuprofen, take 400-600 mg, 3 - 4 times per day.  

## 2023-07-17 NOTE — ED Triage Notes (Signed)
 Patient to ED via POV for centralized, non-radiating CP. Started yesterday. Hurts worse when breathing. Denies cardiac hx

## 2023-09-12 ENCOUNTER — Other Ambulatory Visit: Payer: Self-pay | Admitting: Nurse Practitioner

## 2023-09-12 DIAGNOSIS — B351 Tinea unguium: Secondary | ICD-10-CM

## 2023-09-12 NOTE — Telephone Encounter (Signed)
 Copied from CRM [id number]. Topic: Clinical - Medication Refill >> Sep 12, 2023 10:01 AM Graeme ORN wrote: Medication: fluconazole  (DIFLUCAN ) 150 MG tablet   Has the patient contacted their pharmacy? Yes (Agent: If no, request that the patient contact the pharmacy for the refill. If patient does not wish to contact the pharmacy document the reason why and proceed with request.) (Agent: If yes, when and what did the pharmacy advise?)  This is the patient's preferred pharmacy:  [id number] [place] [facility] [address] [place] [address] Phone: [phone] Fax: [phone]  Is this the correct pharmacy for this prescription? Yes If no, delete pharmacy and type the correct one.   Has the prescription been filled recently? Yes  Is the patient out of the medication? Yes  Has the patient been seen for an appointment in the last year OR does the patient have an upcoming appointment? Yes  Can we respond through MyChart? Yes  Agent: Please be advised that Rx refills may take up to 3 business days. We ask that you follow-up with your pharmacy.

## 2023-09-18 ENCOUNTER — Ambulatory Visit (INDEPENDENT_AMBULATORY_CARE_PROVIDER_SITE_OTHER): Admitting: Medical

## 2023-09-18 ENCOUNTER — Telehealth: Payer: Self-pay

## 2023-09-18 ENCOUNTER — Ambulatory Visit: Payer: Self-pay

## 2023-09-18 VITALS — BP 120/70 | HR 77 | Temp 97.7°F | Wt 153.8 lb

## 2023-09-18 DIAGNOSIS — K5900 Constipation, unspecified: Secondary | ICD-10-CM | POA: Diagnosis not present

## 2023-09-18 DIAGNOSIS — K921 Melena: Secondary | ICD-10-CM

## 2023-09-18 MED ORDER — HYDROCORTISONE ACETATE 25 MG RE SUPP: 25.0000 mg | Freq: Two times a day (BID) | RECTAL | 0 refills | Status: DC

## 2023-09-18 MED ORDER — POLYETHYLENE GLYCOL 3350 17 GM/SCOOP PO POWD: 17.0000 g | Freq: Two times a day (BID) | ORAL | 0 refills | Status: DC | PRN

## 2023-09-18 NOTE — Telephone Encounter (Signed)
  FYI Only or Action Required?: FYI only for provider.  Patient was last seen in primary care on 07/16/2022 by Early, Camie BRAVO, NP.  Called Nurse Triage reporting Rectal Bleeding.  Symptoms began a week ago.  Interventions attempted: Nothing.  Symptoms are: unchanged.  Triage Disposition: No disposition on file.  Patient/caregiver understands and will follow disposition?:    Copied from CRM [id number]. Topic: Clinical - Red Word Triage >> Sep 18, 2023  8:23 AM Franky GRADE wrote: Red Word that prompted transfer to Nurse Triage: Patient has noticed blood in his stool for about a week now. Asked if patient was experiencing any other symptoms but he denied. Reason for Disposition  MILD rectal bleeding (e.g., more than just a few drops or streaks)  Answer Assessment - Initial Assessment Questions 1. APPEARANCE of BLOOD: What color is it? Is it passed separately, on the surface of the stool, or mixed in with the stool?      Bright red 2. AMOUNT: How much blood was passed?      Mild amount, he thinks 3. FREQUENCY: How many times has blood been passed with the stools?      2 times in about a week 4. ONSET: When was the blood first seen in the stools? (Days or weeks)      A week ago 5. DIARRHEA: Is there also some diarrhea? If Yes, ask: How many diarrhea stools in the past 24 hours?      denies 6. CONSTIPATION: Do you have constipation? If Yes, ask: How bad is it?     no 7. RECURRENT SYMPTOMS: Have you had blood in your stools before? If Yes, ask: When was the last time? and What happened that time?      Yes, one time, from straining 8. BLOOD THINNERS: Do you take any blood thinners? (e.g., aspirin, clopidogrel / Plavix, coumadin, heparin). Notes: Other strong blood thinners include: Arixtra (fondaparinux), Eliquis (apixaban), Pradaxa (dabigatran), and Xarelto (rivaroxaban).     denies 9. OTHER SYMPTOMS: Do you have any other symptoms?  (e.g., abdomen pain, vomiting,  dizziness, fever)     no 10. PREGNANCY: Is there any chance you are pregnant? When was your last menstrual period?       na  Protocols used: Rectal Bleeding-A-AH

## 2023-09-18 NOTE — Telephone Encounter (Signed)
 Pt. Already sent letter with Bryan Gent PA name he did not see Bryan Doing NP today.   Copied from CRM [id number]. Topic: General - Other >> Sep 18, 2023  1:35 PM Bryan Ruiz wrote: Reason for CRM: Patient is requesting a doctor note from doctor early please with her name and his name on it Please email to patient if not call he will come

## 2023-09-18 NOTE — Progress Notes (Signed)
 Subjective:  Bryan Ruiz is a 48 y.o. male who presents for Chief Complaint  Patient presents with   Acute Visit    Rectal bleeding x 2 weeks. Only had a bowel movements 3 times in the last 2 weeks. Sometimes feels like he is pushing during BM. Usually has BM twice a week     Here for concern for blood in stool, quite often with BMs in past 2 weeks.   Can see it on bottom of toilet, occasional on toilet paper, and not mixed in with the stool.   Will see it after the first or second piece of stool that comes out.   No rectal pain, no belly pain, no fever, no chills.   No hemorrhoids or swelling at anus.  No pain with wiping.  Only 3 BM in last 2 weeks.   Was out of town recently as well.   Usually have daily or every other day BM.  He has been out of town traveling, eating some steak and hot dogs.  No other aggravating or relieving factors.    No other c/o.  Past Medical History:  Diagnosis Date   History of tinea cruris    History of vertigo 2009   once prior as of 11/2014    Onychomycosis    Current Outpatient Medications on File Prior to Visit  Medication Sig Dispense Refill   fluconazole  (DIFLUCAN ) 150 MG tablet TAKE 1 TABLET BY MOUTH ONE TIME PER WEEK 12 tablet 2   No current facility-administered medications on file prior to visit.     The following portions of the patient's history were reviewed and updated as appropriate: allergies, current medications, past family history, past medical history, past social history, past surgical history and problem list.  ROS Otherwise as in subjective above    Objective: BP 120/70   Pulse 77   Temp 97.7 F (36.5 C)   Wt 153 lb 12.8 oz (69.8 kg)   BMI 22.07 kg/m   Wt Readings from Last 3 Encounters:  09/18/23 153 lb 12.8 oz (69.8 kg)  07/17/23 156 lb (70.8 kg)  09/16/22 150 lb (68 kg)   General appearance: alert, no distress, well developed, well nourished Abdomen: +bs, soft, non tender, non distended, no masses, no  hepatomegaly, no splenomegaly Pulses: 2+ radial pulses, 2+ pedal pulses, normal cap refill Ext: no edema     Assessment: Encounter Diagnoses  Name Primary?   Constipation, unspecified constipation type Yes   Blood in stool      Plan:  Symptoms suggest constipation or possible internal hemorrhoids.  He is not drinking much water daily either.  Recommendations: Short-term for the next few days or possibly for the next week use MiraLAX  powder, 1 capful in a beverage once or twice a day to help with constipation Increase water intake to at least 80 to 100 ounces of water daily Get fiber in the diet throughout the day Avoid lots of cheese and meat and processed foods which can make you feel constipated Short-term limit foods such as cabbage or onions that can make you feel gassy and bloated Avoid ibuprofen  or Aleve  or Motrin  or other anti-inflammatories on a regular basis.  You can use these as needed but not frequently If you still continue to see blood in the stool over the next day or 2 then add Anusol /hydrocortisone  rectal suppository once or twice daily for a couple days to help with internal inflammation If you do not see improvement at all within  the next 2 weeks or if new or worsening symptoms then recheck   Bryan Ruiz was seen today for acute visit.  Diagnoses and all orders for this visit:  Constipation, unspecified constipation type  Blood in stool    Follow up: prn

## 2023-09-18 NOTE — Patient Instructions (Signed)
 Symptoms suggest constipation or possible internal hemorrhoids  Recommendations: Short-term for the next few days or possibly for the next week use MiraLAX  powder, 1 capful in a beverage once or twice a day to help with constipation Increase water intake to at least 80 to 100 ounces of water daily Get fiber in the diet throughout the day Avoid lots of cheese and meat and processed foods which can make you feel constipated Short-term limit foods such as cabbage or onions that can make you feel gassy and bloated Avoid ibuprofen  or Aleve  or Motrin  or other anti-inflammatories on a regular basis.  You can use these as needed but not frequently If you still continue to see blood in the stool over the next day or 2 then add Anusol /hydrocortisone  rectal suppository once or twice daily for a couple days to help with internal inflammation If you do not see improvement at all within the next 2 weeks or if new or worsening symptoms then recheck

## 2023-09-18 NOTE — Telephone Encounter (Signed)
 Copied from CRM [id number]. Topic: General - Other >> Sep 18, 2023  1:35 PM Marissa P wrote: Reason for CRM: Patient is requesting a doctor note from doctor early please with her name and his name on it Please email to patient if not call he will come     Work note sent to [email or web address].

## 2023-10-11 ENCOUNTER — Emergency Department

## 2023-10-11 ENCOUNTER — Inpatient Hospital Stay
Admission: EM | Admit: 2023-10-11 | Discharge: 2023-10-14 | DRG: 389 | Disposition: A | Discharge status: Home / Self Care | Attending: Internal Medicine | Admitting: Internal Medicine

## 2023-10-11 ENCOUNTER — Other Ambulatory Visit: Payer: Self-pay

## 2023-10-11 DIAGNOSIS — K56600 Partial intestinal obstruction, unspecified as to cause: Principal | ICD-10-CM | POA: Diagnosis present

## 2023-10-11 DIAGNOSIS — D72829 Elevated white blood cell count, unspecified: Secondary | ICD-10-CM | POA: Diagnosis present

## 2023-10-11 DIAGNOSIS — Z833 Family history of diabetes mellitus: Secondary | ICD-10-CM | POA: Diagnosis not present

## 2023-10-11 DIAGNOSIS — B351 Tinea unguium: Secondary | ICD-10-CM | POA: Diagnosis present

## 2023-10-11 DIAGNOSIS — Z83438 Family history of other disorder of lipoprotein metabolism and other lipidemia: Secondary | ICD-10-CM

## 2023-10-11 DIAGNOSIS — K56609 Unspecified intestinal obstruction, unspecified as to partial versus complete obstruction: Secondary | ICD-10-CM

## 2023-10-11 DIAGNOSIS — Z8249 Family history of ischemic heart disease and other diseases of the circulatory system: Secondary | ICD-10-CM | POA: Diagnosis not present

## 2023-10-11 DIAGNOSIS — Z8042 Family history of malignant neoplasm of prostate: Secondary | ICD-10-CM | POA: Diagnosis not present

## 2023-10-11 DIAGNOSIS — E871 Hypo-osmolality and hyponatremia: Secondary | ICD-10-CM | POA: Diagnosis present

## 2023-10-11 HISTORY — DX: Unspecified intestinal obstruction, unspecified as to partial versus complete obstruction: K56.609

## 2023-10-11 LAB — URINALYSIS, ROUTINE W REFLEX MICROSCOPIC
Bilirubin Urine: NEGATIVE
Glucose, UA: NEGATIVE mg/dL
Hgb urine dipstick: NEGATIVE
Ketones, ur: 80 mg/dL — AB
Leukocytes,Ua: NEGATIVE
Nitrite: NEGATIVE
Protein, ur: 30 mg/dL — AB
Specific Gravity, Urine: 1.032 — ABNORMAL HIGH (ref 1.005–1.030)
pH: 5 (ref 5.0–8.0)

## 2023-10-11 LAB — COMPREHENSIVE METABOLIC PANEL WITH GFR
ALT: 19 U/L (ref 0–44)
AST: 22 U/L (ref 15–41)
Albumin: 4.8 g/dL (ref 3.5–5.0)
Alkaline Phosphatase: 32 U/L — ABNORMAL LOW (ref 38–126)
Anion gap: 12 (ref 5–15)
BUN: 16 mg/dL (ref 6–20)
CO2: 24 mmol/L (ref 22–32)
Calcium: 9.7 mg/dL (ref 8.9–10.3)
Chloride: 97 mmol/L — ABNORMAL LOW (ref 98–111)
Creatinine, Ser: 1.1 mg/dL (ref 0.61–1.24)
GFR, Estimated: >60 mL/min (ref >60)
Glucose, Bld: 120 mg/dL — ABNORMAL HIGH (ref 70–99)
Potassium: 3.9 mmol/L (ref 3.5–5.1)
Sodium: 133 mmol/L — ABNORMAL LOW (ref 135–145)
Total Bilirubin: 2.4 mg/dL — ABNORMAL HIGH (ref 0.0–1.2)
Total Protein: 8.8 g/dL — ABNORMAL HIGH (ref 6.5–8.1)

## 2023-10-11 LAB — CBC
HCT: 48.6 % (ref 39.0–52.0)
Hemoglobin: 17 g/dL (ref 13.0–17.0)
MCH: 29.3 pg (ref 26.0–34.0)
MCHC: 35 g/dL (ref 30.0–36.0)
MCV: 83.8 fL (ref 80.0–100.0)
Platelets: 235 K/uL (ref 150–400)
RBC: 5.8 MIL/uL (ref 4.22–5.81)
RDW: 11.3 % — ABNORMAL LOW (ref 11.5–15.5)
WBC: 11.7 K/uL — ABNORMAL HIGH (ref 4.0–10.5)
nRBC: 0 % (ref 0.0–0.2)

## 2023-10-11 LAB — LIPASE, BLOOD: Lipase: 28 U/L (ref 11–51)

## 2023-10-11 MED ORDER — SODIUM CHLORIDE 0.9 % IV SOLN: INTRAVENOUS | Status: DC

## 2023-10-11 MED ORDER — ONDANSETRON HCL 4 MG/2ML IJ SOLN
4.0000 mg | Freq: Three times a day (TID) | INTRAMUSCULAR | Status: DC | PRN
  Administered 2023-10-12: 4 mg via INTRAVENOUS
  Filled 2023-10-11: qty 2

## 2023-10-11 MED ORDER — MORPHINE SULFATE (PF) 2 MG/ML IV SOLN
2.0000 mg | INTRAVENOUS | Status: DC | PRN
  Administered 2023-10-12 (×2): 2 mg via INTRAVENOUS
  Filled 2023-10-11 (×2): qty 1

## 2023-10-11 MED ORDER — KETOROLAC TROMETHAMINE 15 MG/ML IJ SOLN
15.0000 mg | Freq: Once | INTRAMUSCULAR | Status: AC
  Administered 2023-10-11: 15 mg via INTRAVENOUS
  Filled 2023-10-11: qty 1

## 2023-10-11 MED ORDER — OXYCODONE-ACETAMINOPHEN 5-325 MG PO TABS
1.0000 | ORAL_TABLET | ORAL | Status: DC | PRN
  Administered 2023-10-14 (×2): 1 via ORAL
  Filled 2023-10-11: qty 1

## 2023-10-11 MED ORDER — IOHEXOL 300 MG/ML  SOLN
100.0000 mL | Freq: Once | INTRAMUSCULAR | Status: AC | PRN
  Administered 2023-10-11: 100 mL via INTRAVENOUS

## 2023-10-11 MED ORDER — SODIUM CHLORIDE 0.9 % IV BOLUS
1000.0000 mL | Freq: Once | INTRAVENOUS | Status: AC
  Administered 2023-10-11: 1000 mL via INTRAVENOUS

## 2023-10-11 NOTE — ED Triage Notes (Signed)
 Pt presents via POV c/o lower abd pain since yesterday. Report N/V/D. Reports took gas pill without relief.

## 2023-10-11 NOTE — Consult Note (Signed)
 [facility] SURGICAL CONSULTATION NOTE (initial) - cpt: 9925***1/2/3/4/5   HISTORY OF PRESENT ILLNESS (HPI):  48 y.o. male presented to [facility] ED ***today for evaluation of ***. Patient reports ***  Surgery is consulted by *** physician Dr. PIERRETTE in this context for evaluation and management*** of ***.  PAST MEDICAL HISTORY (PMH):  Past Medical History:  Diagnosis Date  . History of tinea cruris   . History of vertigo 2009   once prior as of 11/2014   . Onychomycosis      PAST SURGICAL HISTORY (PSH):  Past Surgical History:  Procedure Laterality Date  . ANKLE SURGERY    . EXCISION MASS HEAD N/A 08/24/2021   Procedure: EXCISION MASS FOREHEAD;  Surgeon: Elisabeth Craig RAMAN, MD;  Location: [facility];  Service: Plastics;  Laterality: N/A;  . VASECTOMY       MEDICATIONS:  Prior to Admission medications   Medication Sig Start Date End Date Taking? Authorizing Provider  fluconazole  (DIFLUCAN ) 150 MG tablet TAKE 1 TABLET BY MOUTH ONE TIME PER WEEK 01/28/23  Yes Early, Sara E, NP  hydrocortisone  (ANUSOL -HC) 25 MG suppository Place 1 suppository (25 mg total) rectally 2 (two) times daily. Patient not taking: Reported on 10/11/2023 09/18/23   Tysinger, Alm RAMAN, PA-C  polyethylene glycol powder (GLYCOLAX /MIRALAX ) 17 GM/SCOOP powder Take 17 g by mouth 2 (two) times daily as needed. Patient not taking: Reported on 10/11/2023 09/18/23   Tysinger, Alm RAMAN, PA-C     ALLERGIES:  No Known Allergies   SOCIAL HISTORY:  Social History   Socioeconomic History  . Marital status: Single    Spouse name: Not on file  . Number of children: Not on file  . Years of education: Not on file  . Highest education level: Not on file  Occupational History  . Not on file  Tobacco Use  . Smoking status: Never  . Smokeless tobacco: Never  Vaping Use  . Vaping status: Never Used  Substance and Sexual Activity  . Alcohol use: No  . Drug use: No  . Sexual activity: Yes    Partners:  Female  Other Topics Concern  . Not on file  Social History Narrative  . Not on file   Social Drivers of Health   Financial Resource Strain: Not on file  Food Insecurity: Not on file  Transportation Needs: Not on file  Physical Activity: Not on file  Stress: Not on file  Social Connections: Not on file  Intimate Partner Violence: Not on file     FAMILY HISTORY:  Family History  Problem Relation Age of Onset  . Diabetes Mother   . Hypertension Mother   . Hyperlipidemia Mother   . Prostate cancer Father 40       surgery   . Hypertension Father   . Hyperlipidemia Father   . Diabetes Maternal Grandmother   . Kidney disease Maternal Grandmother   . CVA Neg Hx   . Colon cancer Neg Hx   . Esophageal cancer Neg Hx   . Rectal cancer Neg Hx   . Stomach cancer Neg Hx       REVIEW OF SYSTEMS:  ROS  VITAL SIGNS:  Temp:  [98.3 F (36.8 C)] 98.3 F (36.8 C) (08/09 1947) Pulse Rate:  [107] 107 (08/09 1947) Resp:  [16] 16 (08/09 1947) BP: (105)/(89) 105/89 (08/09 1947) SpO2:  [97 %] 97 % (08/09 1947)             INTAKE/OUTPUT:  No  intake/output data recorded.  PHYSICAL EXAM:  Physical Exam Blood pressure 105/89, pulse (!) 107, temperature 98.3 F (36.8 C), temperature source Oral, resp. rate 16, SpO2 97%.   CONSTITUTIONAL: Well developed, and nourished, appropriately responsive and aware without distress. ***  EYES: Sclera non-icteric.   EARS, NOSE, MOUTH AND THROAT: Mask worn.  *** The oropharynx is clear. Oral mucosa is pink and moist.  Dentition: ***   Hearing is intact to voice.  NECK: Trachea is midline, and there is no jugular venous distension.  LYMPH NODES:  Lymph nodes in the neck are not enlarged. RESPIRATORY:  Lungs are clear, and breath sounds are equal bilaterally.  *** Normal respiratory effort without pathologic use of accessory muscles. CARDIOVASCULAR: Heart is regular in rate and rhythm.  *** Well perfused.  GI: The abdomen is *** soft, nontender, and  nondistended. There were no palpable masses. I did not appreciate hepatosplenomegaly. There were normal bowel sounds. GU: *** MUSCULOSKELETAL:  Symmetrical muscle tone appreciated in all four extremities.*** Warm without edema.  SKIN: Skin turgor is normal. No pathologic skin lesions appreciated.  NEUROLOGIC:  Motor and sensation appear grossly normal.  Cranial nerves are grossly without defect. PSYCH:  Alert and oriented to person, place and time. Affect is appropriate for situation.  Data Reviewed I have personally reviewed what is currently available of the patient's imaging, recent labs and medical records.    Labs:     Latest Ref Rng & Units 10/11/2023    7:49 PM 07/17/2023    2:21 PM 07/02/2021    2:52 PM  CBC  WBC 4.0 - 10.5 K/uL 11.7  4.3  4.0   Hemoglobin 13.0 - 17.0 g/dL 82.9  85.9  85.0   Hematocrit 39.0 - 52.0 % 48.6  40.3  42.8   Platelets 150 - 400 K/uL 235  185  187       Latest Ref Rng & Units 10/11/2023    7:49 PM 07/17/2023    2:21 PM 11/12/2021    2:55 PM  CMP  Glucose 70 - 99 mg/dL 879  69    BUN 6 - 20 mg/dL 16  13    Creatinine 9.38 - 1.24 mg/dL 8.89  8.91    Sodium 864 - 145 mmol/L 133  140    Potassium 3.5 - 5.1 mmol/L 3.9  3.9    Chloride 98 - 111 mmol/L 97  103    CO2 22 - 32 mmol/L 24  28    Calcium 8.9 - 10.3 mg/dL 9.7  9.2    Total Protein 6.5 - 8.1 g/dL 8.8     Total Bilirubin 0.0 - 1.2 mg/dL 2.4     Alkaline Phos 38 - 126 U/L 32     AST 15 - 41 U/L 22     ALT 0 - 44 U/L 19   38      Imaging studies:  {Labs :18171} Last 24 hrs: CT ABDOMEN PELVIS W CONTRAST Result Date: 10/11/2023 CLINICAL DATA:  Abdominal pain, acute, nonlocalizedpt presents via POV c/o lower abd pain since yesterday. Report N/V/D. Reports took gas pill without relief. EXAM: CT ABDOMEN AND PELVIS WITH CONTRAST TECHNIQUE: Multidetector CT imaging of the abdomen and pelvis was performed using the standard protocol following bolus administration of intravenous contrast. RADIATION DOSE  REDUCTION: This exam was performed according to the departmental dose-optimization program which includes automated exposure control, adjustment of the mA and/or kV according to patient size and/or use of iterative reconstruction technique. CONTRAST:  OMNIPAQUE  IOHEXOL  300 MG/ML  SOLN COMPARISON:  None Available. FINDINGS: Lower chest: No acute abnormality. Hepatobiliary: Subcentimeter pericentimeter hypodensities likely represent simple hepatic cysts. No gallstones, gallbladder wall thickening, or pericholecystic fluid. No biliary dilatation. Pancreas: No focal lesion. Normal pancreatic contour. No surrounding inflammatory changes. No main pancreatic ductal dilatation. Spleen:  Normal in size without focal abnormality. Adrenals/Urinary Tract: No adrenal nodule bilaterally. Bilateral kidneys enhance symmetrically. No hydronephrosis. No hydroureter. The urinary bladder is unremarkable. Stomach/Bowel: Stomach is within dilated with fluid. The majority of the proximal mid small bowel is dilated with fluid with query transition point within the mid abdomen (2:44, 5: 24). Distal ileum is mostly decompressed. The colon is mostly decompressed. No evidence of large bowel wall thickening or dilatation. Appendix appears normal. Vascular/Lymphatic: No abdominal aorta or iliac aneurysm. No abdominal, pelvic, or inguinal lymphadenopathy. Reproductive: Prostate is unremarkable. Other: No intraperitoneal free fluid. No intraperitoneal free gas. No organized fluid collection. Musculoskeletal: No abdominal wall hernia or abnormality. No suspicious lytic or blastic osseous lesions. No acute displaced fracture. IMPRESSION: High-grade small-bowel obstruction with query transition point within the mid abdomen. Electronically Signed   By: Morgane  Naveau M.D.   On: 10/11/2023 22:24     Assessment/Plan:  48 y.o. male with ***, complicated by pertinent comorbidities including ***.  Patient Active Problem List   Diagnosis Date  Noted  . SBO (small bowel obstruction) (HCC) 10/11/2023  . Leukocytosis 10/11/2023  . Injury of right shoulder 08/02/2022  . Extensor tendonitis of foot 02/27/2022  . Elevated lipids 07/03/2021  . Body mass index (BMI) of 21.0 to 21.9 in adult 07/02/2021  . 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 Onychomycosis   . History of tinea cruris   . History of vertigo 03/05/2007    - ***   - ***   - ***   - DVT prophylaxis  All of the above findings and recommendations were discussed with the patient ***and *** family(if present), and all of patient's ***and present family's questions were answered to their expressed satisfaction.  I personally spent a total of *** minutes in the care of the patient today including {Time Based Coding:210964241}.    Thank you for the opportunity to participate in this patient's care.   -- Honor Leghorn, M.D., FACS 10/11/2023, 11:24 PM

## 2023-10-11 NOTE — ED Provider Notes (Signed)
 [facility] Provider Note    Event Date/Time   First MD Initiated Contact with Patient 10/11/23 2103     (approximate)   History   Abdominal Pain   HPI  Bryan Ruiz is a 48 y.o. male with PMH of vertigo presents for evaluation of abdominal pain. Patient states it began yesterday. Pain is across the lower abdomen. He endorses N/V/D and chills but no fever. No urinary symptoms. Feels like his mouth is dry. He tried taking a gas pill but vomited this up.       Physical Exam   Triage Vital Signs: ED Triage Vitals [10/11/23 1947]  Encounter Vitals Group     BP 105/89     Girls Systolic BP Percentile      Girls Diastolic BP Percentile      Boys Systolic BP Percentile      Boys Diastolic BP Percentile      Pulse Rate (!) 107     Resp 16     Temp 98.3 F (36.8 C)     Temp Source Oral     SpO2 97 %     Weight      Height      Head Circumference      Peak Flow      Pain Score 8     Pain Loc      Pain Education      Exclude from Growth Chart     Most recent vital signs: Vitals:   10/11/23 1947  BP: 105/89  Pulse: (!) 107  Resp: 16  Temp: 98.3 F (36.8 C)  SpO2: 97%    General: Awake, uncomfortable appearing. CV:  Good peripheral perfusion. RRR. Resp:  Normal effort. CTAB. Abd:  Distended, TTP across the lower abdomen, no overlying skin changes.  Other:     ED Results / Procedures / Treatments   Labs (all labs ordered are listed, but only abnormal results are displayed) Labs Reviewed  COMPREHENSIVE METABOLIC PANEL WITH GFR - Abnormal; Notable for the following components:      Result Value   Sodium 133 (*)    Chloride 97 (*)    Glucose, Bld 120 (*)    Total Protein 8.8 (*)    Alkaline Phosphatase 32 (*)    Total Bilirubin 2.4 (*)    All other components within normal limits  CBC - Abnormal; Notable for the following components:   WBC 11.7 (*)    RDW 11.3 (*)    All other components within normal limits  URINALYSIS,  ROUTINE W REFLEX MICROSCOPIC - Abnormal; Notable for the following components:   Color, Urine YELLOW (*)    APPearance HAZY (*)    Specific Gravity, Urine 1.032 (*)    Ketones, ur 80 (*)    Protein, ur 30 (*)    Bacteria, UA RARE (*)    All other components within normal limits  LIPASE, BLOOD  PROTIME-INR  APTT  HIV ANTIBODY (ROUTINE TESTING W REFLEX)  BASIC METABOLIC PANEL WITH GFR  CBC  TYPE AND SCREEN    RADIOLOGY  CT abdomen pelvis obtained, I interpreted the images as well as reviewed the radiologist report.  Images show high-grade small bowel obstruction.   PROCEDURES:  Critical Care performed: No  Procedures   MEDICATIONS ORDERED IN ED: Medications  0.9 %  sodium chloride  infusion (has no administration in time range)  morphine  (PF) 2 MG/ML injection 2 mg (has no administration in time range)  oxyCODONE -acetaminophen  (PERCOCET/ROXICET)  5-325 MG per tablet 1 tablet (has no administration in time range)  ondansetron  (ZOFRAN ) injection 4 mg (has no administration in time range)  sodium chloride  0.9 % bolus 1,000 mL (1,000 mLs Intravenous New Bag/Given 10/11/23 2159)  ketorolac  (TORADOL ) 15 MG/ML injection 15 mg (15 mg Intravenous Given 10/11/23 2159)  iohexol  (OMNIPAQUE ) 300 MG/ML solution 100 mL (100 mLs Intravenous Contrast Given 10/11/23 2209)     IMPRESSION / MDM / ASSESSMENT AND PLAN / ED COURSE  I reviewed the triage vital signs and the nursing notes.                             48 year old male presents for evaluation of abdominal pain.  Patient is tachycardic on initial presentation and does appear to be quite uncomfortable.  Vital signs stable otherwise.  Differential diagnosis includes, but is not limited to, biliary disease, pancreatitis, appendicitis, diverticulitis, gastroenteritis, UTI, pyelonephritis.  Patient's presentation is most consistent with acute complicated illness / injury requiring diagnostic workup.  Will obtain labs, UA and  imaging.  Clinical Course as of 10/11/23 2318  Sat Oct 11, 2023  2139 CBC(!) Very mild leukocytosis, otherwise unremarkable. [LD]  2140 Lipase, blood Within normal limits. [LD]  2140 Comprehensive metabolic panel(!) Elevated bilirubin, normal LFTs.  No tenderness to palpation in the right upper quadrant. [LD]  2141 Urinalysis, Routine w reflex microscopic -Urine, Clean Catch(!) Presence of ketones and protein with rare bacteria.  Do not feel this is consistent with infection but does appear to be a bit dehydrated.   [LD]  2141 Patient was tender to palpation across the lower abdomen on exam.  Will place IV, give IV fluids, pain medication and obtain CT scan.  Patient in agreement with plan. [LD]  2230 CT ABDOMEN PELVIS W CONTRAST CT shows high grade small bowel obstruction with possible transition point in the mid abdomen. [LD]  2253 Consult to hospitalist placed. [LD]  2303 Patient reassessed, states that his pain is under control.  He is agreeable to admission. [LD]  2318 Hospitalist agreeable to admit. [LD]    Clinical Course User Index [LD] Cleaster Tinnie LABOR, PA-C     FINAL CLINICAL IMPRESSION(S) / ED DIAGNOSES   Final diagnoses:  Small bowel obstruction (HCC)     Rx / DC Orders   ED Discharge Orders     None        Note:  This document was prepared using Dragon voice recognition software and may include unintentional dictation errors.   Cleaster Tinnie LABOR, PA-C 10/11/23 2318    Claudene Rover, MD 10/11/23 [phone]

## 2023-10-11 NOTE — H&P (Incomplete)
 History and Physical    Bryan Ruiz FMW:[id number] DOB: [birth year] DOA: 10/11/2023  Referring MD/NP/PA:   PCP: Oris Camie BRAVO, NP   Patient coming from:  The patient is coming from home.     Chief Complaint: Nausea, vomiting, abdominal pain  HPI: Bryan Ruiz is a 48 y.o. male with medical history significant of vasectomy, who presents with nausea, vomiting, abdominal pain.  CT scan showed igh-grade small-bowel obstruction with query transition point within the mid abdomen. Will place NG tube.         Data reviewed independently and ED Course: pt was found to have     ***       EKG: I have personally reviewed.  Not done in ED, will get one.   ***   Review of Systems:   General: no fevers, chills, no body weight gain, has poor appetite, has fatigue HEENT: no blurry vision, hearing changes or sore throat Respiratory: no dyspnea, coughing, wheezing CV: no chest pain, no palpitations GI: no nausea, vomiting, abdominal pain, diarrhea, constipation GU: no dysuria, burning on urination, increased urinary frequency, hematuria  Ext: no leg edema Neuro: no unilateral weakness, numbness, or tingling, no vision change or hearing loss Skin: no rash, no skin tear. MSK: No muscle spasm, no deformity, no limitation of range of movement in spin Heme: No easy bruising.  Travel history: No recent long distant travel.   Allergy: No Known Allergies  Past Medical History:  Diagnosis Date   History of tinea cruris    History of vertigo 2009   once prior as of 11/2014    Onychomycosis     Past Surgical History:  Procedure Laterality Date   ANKLE SURGERY     EXCISION MASS HEAD N/A 08/24/2021   Procedure: EXCISION MASS FOREHEAD;  Surgeon: Elisabeth Craig RAMAN, MD;  Location: [facility];  Service: Plastics;  Laterality: N/A;   VASECTOMY      Social History:  reports that he has never smoked. He has never used smokeless tobacco. He reports that he does not drink alcohol  and does not use drugs.  Family History:  Family History  Problem Relation Age of Onset   Diabetes Mother    Hypertension Mother    Hyperlipidemia Mother    Prostate cancer Father 20       surgery    Hypertension Father    Hyperlipidemia Father    Diabetes Maternal Grandmother    Kidney disease Maternal Grandmother    CVA Neg Hx    Colon cancer Neg Hx    Esophageal cancer Neg Hx    Rectal cancer Neg Hx    Stomach cancer Neg Hx      Prior to Admission medications   Medication Sig Start Date End Date Taking? Authorizing Provider  fluconazole  (DIFLUCAN ) 150 MG tablet TAKE 1 TABLET BY MOUTH ONE TIME PER WEEK 01/28/23   Early, Sara E, NP  hydrocortisone  (ANUSOL -HC) 25 MG suppository Place 1 suppository (25 mg total) rectally 2 (two) times daily. 09/18/23   Tysinger, Alm RAMAN, PA-C  polyethylene glycol powder (GLYCOLAX /MIRALAX ) 17 GM/SCOOP powder Take 17 g by mouth 2 (two) times daily as needed. 09/18/23   Tysinger, Alm RAMAN, PA-C    Physical Exam: Vitals:   10/11/23 1947  BP: 105/89  Pulse: (!) 107  Resp: 16  Temp: 98.3 F (36.8 C)  TempSrc: Oral  SpO2: 97%   General: Not in acute distress HEENT:       Eyes: PERRL, EOMI,  no jaundice       ENT: No discharge from the ears and nose, no pharynx injection, no tonsillar enlargement.        Neck: No JVD, no bruit, no mass felt. Heme: No neck lymph node enlargement. Cardiac: S1/S2, RRR, No murmurs, No gallops or rubs. Respiratory: No rales, wheezing, rhonchi or rubs. GI: Soft, nondistended, nontender, no rebound pain, no organomegaly, BS present. GU: No hematuria Ext: No pitting leg edema bilaterally. 1+DP/PT pulse bilaterally. Musculoskeletal: No joint deformities, No joint redness or warmth, no limitation of ROM in spin. Skin: No rashes.  Neuro: Alert, oriented X3, cranial nerves II-XII grossly intact, moves all extremities normally. Muscle strength 5/5 in all extremities, sensation to light touch intact. Brachial reflex 2+  bilaterally. Knee reflex 1+ bilaterally. Negative Babinski's sign. Normal finger to nose test. Psych: Patient is not psychotic, no suicidal or hemocidal ideation.  Labs on Admission: I have personally reviewed following labs and imaging studies  CBC: Recent Labs  Lab 10/11/23 1949  WBC 11.7*  HGB 17.0  HCT 48.6  MCV 83.8  PLT 235   Basic Metabolic Panel: Recent Labs  Lab 10/11/23 1949  NA 133*  K 3.9  CL 97*  CO2 24  GLUCOSE 120*  BUN 16  CREATININE 1.10  CALCIUM 9.7   GFR: CrCl cannot be calculated (Unknown ideal weight.). Liver Function Tests: Recent Labs  Lab 10/11/23 1949  AST 22  ALT 19  ALKPHOS 32*  BILITOT 2.4*  PROT 8.8*  ALBUMIN 4.8   Recent Labs  Lab 10/11/23 1949  LIPASE 28   No results for input(s): AMMONIA in the last 168 hours. Coagulation Profile: No results for input(s): INR, PROTIME in the last 168 hours. Cardiac Enzymes: No results for input(s): CKTOTAL, CKMB, CKMBINDEX, TROPONINI in the last 168 hours. BNP (last 3 results) No results for input(s): PROBNP in the last 8760 hours. HbA1C: No results for input(s): HGBA1C in the last 72 hours. CBG: No results for input(s): GLUCAP in the last 168 hours. Lipid Profile: No results for input(s): CHOL, HDL, LDLCALC, TRIG, CHOLHDL, LDLDIRECT in the last 72 hours. Thyroid Function Tests: No results for input(s): TSH, T4TOTAL, FREET4, T3FREE, THYROIDAB in the last 72 hours. Anemia Panel: No results for input(s): VITAMINB12, FOLATE, FERRITIN, TIBC, IRON, RETICCTPCT in the last 72 hours. Urine analysis:    Component Value Date/Time   COLORURINE YELLOW (A) 10/11/2023 1949   APPEARANCEUR HAZY (A) 10/11/2023 1949   LABSPEC 1.032 (H) 10/11/2023 1949   LABSPEC 1.030 12/06/2019 1458   PHURINE 5.0 10/11/2023 1949   GLUCOSEU NEGATIVE 10/11/2023 1949   HGBUR NEGATIVE 10/11/2023 1949   BILIRUBINUR NEGATIVE 10/11/2023 1949   BILIRUBINUR negative  12/06/2019 1458   BILIRUBINUR n 10/23/2015 1048   KETONESUR 80 (A) 10/11/2023 1949   PROTEINUR 30 (A) 10/11/2023 1949   UROBILINOGEN 1.0 10/23/2015 1048   UROBILINOGEN 1.0 10/24/2013 1232   NITRITE NEGATIVE 10/11/2023 1949   LEUKOCYTESUR NEGATIVE 10/11/2023 1949   Sepsis Labs: @LABRCNTIP (procalcitonin:4,lacticidven:4) )No results found for this or any previous visit (from the past 240 hours).   Radiological Exams on Admission:   Assessment/Plan Active Problems:   * No active hospital problems. *   Assessment and Plan: No notes have been filed under this hospital service. Service: Hospitalist      Active Problems:   * No active hospital problems. *    DVT ppx: SQ Heparin         SQ Lovenox  Code Status: Full code   ***  Family Communication:     not done, no family member is at bed side.              Yes, patient's    at bed side.       by phone   ***  Disposition Plan:  Anticipate discharge back to previous environment  Consults called:    Admission status and Level of care: :    for obs as inpt        Dispo: The patient is from: {From:23814}              Anticipated d/c is to: {To:23815}              Anticipated d/c date is: {Days:23816}              Patient currently {Medically stable:23817}    Severity of Illness:  {Observation/Inpatient:21159}       Date of Service 10/11/2023    Caleb Exon [facility]   If 7PM-7AM, please contact night-coverage [email or web address] 10/11/2023, 11:05 PM

## 2023-10-12 ENCOUNTER — Inpatient Hospital Stay

## 2023-10-12 ENCOUNTER — Encounter: Payer: Self-pay | Admitting: Internal Medicine

## 2023-10-12 DIAGNOSIS — K56609 Unspecified intestinal obstruction, unspecified as to partial versus complete obstruction: Secondary | ICD-10-CM | POA: Diagnosis not present

## 2023-10-12 DIAGNOSIS — D72829 Elevated white blood cell count, unspecified: Secondary | ICD-10-CM | POA: Diagnosis not present

## 2023-10-12 LAB — CBC
HCT: 42.4 % (ref 39.0–52.0)
Hemoglobin: 14.5 g/dL (ref 13.0–17.0)
MCH: 29.4 pg (ref 26.0–34.0)
MCHC: 34.2 g/dL (ref 30.0–36.0)
MCV: 86 fL (ref 80.0–100.0)
Platelets: 203 K/uL (ref 150–400)
RBC: 4.93 MIL/uL (ref 4.22–5.81)
RDW: 11.3 % — ABNORMAL LOW (ref 11.5–15.5)
WBC: 8.8 K/uL (ref 4.0–10.5)
nRBC: 0 % (ref 0.0–0.2)

## 2023-10-12 LAB — BASIC METABOLIC PANEL WITH GFR
Anion gap: 7 (ref 5–15)
BUN: 16 mg/dL (ref 6–20)
CO2: 25 mmol/L (ref 22–32)
Calcium: 8.2 mg/dL — ABNORMAL LOW (ref 8.9–10.3)
Chloride: 104 mmol/L (ref 98–111)
Creatinine, Ser: 0.86 mg/dL (ref 0.61–1.24)
GFR, Estimated: >60 mL/min (ref >60)
Glucose, Bld: 118 mg/dL — ABNORMAL HIGH (ref 70–99)
Potassium: 3.7 mmol/L (ref 3.5–5.1)
Sodium: 136 mmol/L (ref 135–145)

## 2023-10-12 LAB — HIV ANTIBODY (ROUTINE TESTING W REFLEX): HIV Screen 4th Generation wRfx: NONREACTIVE

## 2023-10-12 LAB — TYPE AND SCREEN
ABO/RH(D): O POS
Antibody Screen: NEGATIVE

## 2023-10-12 LAB — GLUCOSE, CAPILLARY: Glucose-Capillary: 90 mg/dL (ref 70–99)

## 2023-10-12 LAB — APTT: aPTT: 29 s (ref 24–36)

## 2023-10-12 LAB — PROTIME-INR
INR: 1.1 (ref 0.8–1.2)
Prothrombin Time: 14.4 s (ref 11.4–15.2)

## 2023-10-12 MED ORDER — DIATRIZOATE MEGLUMINE & SODIUM 66-10 % PO SOLN
90.0000 mL | Freq: Once | ORAL | Status: AC
  Administered 2023-10-12: 90 mL via NASOGASTRIC

## 2023-10-12 MED ORDER — PHENOL 1.4 % MT LIQD
1.0000 | OROMUCOSAL | Status: DC | PRN
  Filled 2023-10-12: qty 177

## 2023-10-12 NOTE — Plan of Care (Signed)
   Problem: Education: Goal: Knowledge of General Education information will improve Description: Including pain rating scale, medication(s)/side effects and non-pharmacologic comfort measures Outcome: Progressing   Problem: Clinical Measurements: Goal: Ability to maintain clinical measurements within normal limits will improve Outcome: Progressing Goal: Will remain free from infection Outcome: Progressing

## 2023-10-12 NOTE — Plan of Care (Signed)

## 2023-10-12 NOTE — Hospital Course (Addendum)
 Bryan Ruiz is a 48 y.o. male with medical history significant of vasectomy, onychomycosis, who presents with nausea, vomiting, abdominal pain.  Patient also has watery stools. CT scan showed a high-grade small bowel obstruction.  NG suction was started, patient was also placed on fluids and symptomatic treatment.  General surgery consult obtained. Condition improved, has multiple bowel movements, tolerating diet.  Medically stable for discharge

## 2023-10-12 NOTE — Progress Notes (Signed)
  Progress Note   Patient: Bryan Ruiz FMW:[id number] DOB: 1975-08-29 DOA: 10/11/2023     1 DOS: the patient was seen and examined on 10/12/2023   Brief hospital course: Delwin Raczkowski is a 48 y.o. male with medical history significant of vasectomy, onychomycosis, who presents with nausea, vomiting, abdominal pain.  Patient also has watery stools. CT scan showed a high-grade small bowel obstruction.  NG suction was started, patient was also placed on fluids and symptomatic treatment.  General surgery consult obtained.     Principal Problem:   SBO (small bowel obstruction) (HCC) Active Problems:   Leukocytosis   Onychomycosis   Assessment and Plan: Small bowel obstruction. Patient continues to have watery stools, likely partial small bowel obstruction.  Continue NG suction, continue IV fluids.  Patient is followed by general surgery.  Leukocytosis has resolved.  Hyponatremia. Resolved after IV fluids      Subjective:  Patient no longer has any abdominal pain or nausea vomiting.  He had a loose stools last night, still passing gas today.  Physical Exam: Vitals:   10/11/23 1947 10/12/23 0055 10/12/23 0311 10/12/23 0815  BP: 105/89 111/76 110/74 114/70  Pulse: (!) 107 91 82 79  Resp: 16 19 18 16   Temp: 98.3 F (36.8 C) 98.9 F (37.2 C) 98 F (36.7 C) 98.7 F (37.1 C)  TempSrc: Oral   Oral  SpO2: 97% 97% 100% 100%  Weight:  69.8 kg    Height:  5' 10 (1.778 m)     General exam: Appears calm and comfortable  Respiratory system: Clear to auscultation. Respiratory effort normal. Cardiovascular system: S1 & S2 heard, RRR. No JVD, murmurs, rubs, gallops or clicks. No pedal edema. Gastrointestinal system: Abdomen is nondistended, soft and nontender. No organomegaly or masses felt. Normal bowel sounds heard. Central nervous system: Alert and oriented. No focal neurological deficits. Extremities: Symmetric 5 x 5 power. Skin: No rashes, lesions or ulcers Psychiatry:  Judgement and insight appear normal. Mood & affect appropriate.    Data Reviewed:  CT scan, x-ray and lab results reviewed.  Family Communication: None  Disposition: Status is: Inpatient Remains inpatient appropriate because: Severity of disease.  IV treatment     Time spent: 35 minutes  Author: Murvin Mana, MD 10/12/2023 9:41 AM  For on call review [email or web address].

## 2023-10-13 ENCOUNTER — Inpatient Hospital Stay

## 2023-10-13 DIAGNOSIS — D72829 Elevated white blood cell count, unspecified: Secondary | ICD-10-CM | POA: Diagnosis not present

## 2023-10-13 DIAGNOSIS — K56609 Unspecified intestinal obstruction, unspecified as to partial versus complete obstruction: Secondary | ICD-10-CM | POA: Diagnosis not present

## 2023-10-13 LAB — BASIC METABOLIC PANEL WITH GFR
Anion gap: 6 (ref 5–15)
BUN: 17 mg/dL (ref 6–20)
CO2: 24 mmol/L (ref 22–32)
Calcium: 8 mg/dL — ABNORMAL LOW (ref 8.9–10.3)
Chloride: 111 mmol/L (ref 98–111)
Creatinine, Ser: 1.06 mg/dL (ref 0.61–1.24)
GFR, Estimated: >60 mL/min (ref >60)
Glucose, Bld: 95 mg/dL (ref 70–99)
Potassium: 3.8 mmol/L (ref 3.5–5.1)
Sodium: 141 mmol/L (ref 135–145)

## 2023-10-13 LAB — GLUCOSE, CAPILLARY
Glucose-Capillary: 84 mg/dL (ref 70–99)
Glucose-Capillary: 140 mg/dL — ABNORMAL HIGH (ref 70–99)

## 2023-10-13 LAB — MAGNESIUM: Magnesium: 2 mg/dL (ref 1.7–2.4)

## 2023-10-13 NOTE — TOC CM/SW Note (Signed)
 Transition of Care [facility]) - Inpatient Brief Assessment   Patient Details  Name: Bryan Ruiz MRN: [id number] Date of Birth: 09/08/1975  Transition of Care [facility]) CM/SW Contact:    Asberry CHRISTELLA Jaksch, RN Phone Number: 10/13/2023, 11:50 AM   Clinical Narrative:  Transition of Care [facility]) Screening Note   Patient Details  Name: Bryan Ruiz Date of Birth: 1975/10/28   Transition of Care [facility]) CM/SW Contact:    Asberry CHRISTELLA Jaksch, RN Phone Number: 10/13/2023, 11:50 AM    Transition of Care Department [facility]) has reviewed patient and no TOC needs have been identified at this time. If new patient transition needs arise, please place a TOC consult.     Transition of Care Asessment: Insurance and Status: Insurance coverage has been reviewed Patient has primary care physician: Yes     Prior/Current Home Services: No current home services Social Drivers of Health Review: SDOH reviewed no interventions necessary Readmission risk has been reviewed: Yes Transition of care needs: no transition of care needs at this time

## 2023-10-13 NOTE — Plan of Care (Signed)
   Problem: Education: Goal: Knowledge of General Education information will improve Description: Including pain rating scale, medication(s)/side effects and non-pharmacologic comfort measures Outcome: Progressing   Problem: Clinical Measurements: Goal: Ability to maintain clinical measurements within normal limits will improve Outcome: Progressing Goal: Will remain free from infection Outcome: Progressing

## 2023-10-13 NOTE — Progress Notes (Signed)
 [facility]ICAL PROGRESS NOTE (cpt [id number])  Hospital Day(s): 2.   Post op day(s):  Bryan Ruiz   Interval History: Patient seen and examined, reports good bowel activity now, with no no acute events or new complaints overnight. Patient reports an appetite, and flatus, denies nausea/vomiting, abdominal crampy pain.  Review of Systems:  Constitutional: denies fever, chills  HEENT: denies cough or congestion  Respiratory: denies any shortness of breath  Cardiovascular: denies chest pain or palpitations  Gastrointestinal: denies abdominal pain, N/V, or diarrhea/and bowel function as per interval history Genitourinary: denies burning with urination or urinary frequency Musculoskeletal: denies pain, decreased motor or sensation Integumentary: denies any other rashes or skin discolorations Neurological: denies HA or vision/hearing changes   Vital signs in last 24 hours: [min-max] current  Temp:  [98.6 F (37 C)-99 F (37.2 C)] 99 F (37.2 C) (08/11 0759) Pulse Rate:  [86-96] 86 (08/11 0759) Resp:  [16-18] 16 (08/11 0759) BP: (119-130)/(75-82) 130/82 (08/11 0759) SpO2:  [98 %-100 %] 99 % (08/11 0759)     Height: 5' 10 (177.8 cm) Weight: 69.8 kg BMI (Calculated): 22.08   Intake/Output last 2 shifts:  08/10 0701 - 08/11 0700 In: 2788.2 [I.V.:2788.2] Out: 1150 [Emesis/NG output:1150]   Physical Exam:  Constitutional: alert, cooperative and no distress  HENT: normocephalic without obvious abnormality, NG tube just removed, tissue in left nare. Eyes: PERRL, EOM's grossly intact and symmetric  Neuro: CN II - XII grossly intact and symmetric without deficit  Respiratory: breathing non-labored at rest  Cardiovascular: regular rate and sinus rhythm  Gastrointestinal: soft, non-tender, and non-distended Musculoskeletal: UE and LE FROM, no edema or wounds, motor and sensation grossly intact, NT    Labs:     Latest Ref Rng & Units 10/12/2023    4:20 AM 10/11/2023    7:49 PM  07/17/2023    2:21 PM  CBC  WBC 4.0 - 10.5 K/uL 8.8  11.7  4.3   Hemoglobin 13.0 - 17.0 g/dL 85.4  82.9  85.9   Hematocrit 39.0 - 52.0 % 42.4  48.6  40.3   Platelets 150 - 400 K/uL 203  235  185       Latest Ref Rng & Units 10/13/2023    5:03 AM 10/12/2023    4:20 AM 10/11/2023    7:49 PM  CMP  Glucose 70 - 99 mg/dL 95  881  879   BUN 6 - 20 mg/dL 17  16  16    Creatinine 0.61 - 1.24 mg/dL 8.93  9.13  8.89   Sodium 135 - 145 mmol/L 141  136  133   Potassium 3.5 - 5.1 mmol/L 3.8  3.7  3.9   Chloride 98 - 111 mmol/L 111  104  97   CO2 22 - 32 mmol/L 24  25  24    Calcium 8.9 - 10.3 mg/dL 8.0  8.2  9.7   Total Protein 6.5 - 8.1 g/dL   8.8   Total Bilirubin 0.0 - 1.2 mg/dL   2.4   Alkaline Phos 38 - 126 U/L   32   AST 15 - 41 U/L   22   ALT 0 - 44 U/L   19      Imaging studies:    Assessment/Plan:  48 y.o. male with resolved small bowel obstruction   s/p NG tube decompression for same, complicated by pertinent comorbidities including:  Patient Active Problem List   Diagnosis Date Noted   Small bowel obstruction (HCC) 10/11/2023  Leukocytosis 10/11/2023   Injury of right shoulder 08/02/2022   Extensor tendonitis of foot 02/27/2022   Elevated lipids 07/03/2021   Body mass index (BMI) of 21.0 to 21.9 in adult 07/02/2021   Injury of triangular fibrocartilage complex of left wrist 06/19/2018   Onychomycosis    History of tinea cruris    History of vertigo 03/05/2007     - Clear liquid diet today, DC NG tube.  - May advance diet as tolerated.  - I reviewed recommended bowel regimen for patient including supplemental fiber and MiraLAX  to pursue goals.  - Advised to pursue a goal of 25 to 30 g of fiber daily.  The majority of this may be through natural sources, advised to ensure a minimal daily fiber supplementation.  Various forms of supplements discussed.  Recommend Psyllium husk, with options of mixing with beverage or applesauce to make more tolerable. Strongly advised to  consume more fluids(especially in proximity to fiber intake) and to ensure adequate hydration.   Watch color of urine to determine adequacy of hydration.  Clarity is pursued in urine output, and bowel activity that responds and corresponds to significant meal intake.   We need to avoid deferring having bowel movements, advised to take the time at the first sign of sensation, typically following meals, and in the morning.  The need to avoid more frequently, and the presence of flatus may indicate the need for bowel movement.  Do not defer for later.  Addition of MiraLAX  (or its generic equivalent) may be needed ensure at least twice daily bowel movements.  If multiple doses of MiraLAX  are necessary utilize them. Never skip a day... Do not tolerate a day without a bowel movement unless you are fasting.  To be regular, we must do the above EVERY day.   Soluble Fiber Dissolves in Water: Soluble fiber dissolves in water to form a gel-like substance. Slows Digestion: This type of fiber slows down digestion, which can help control blood sugar levels and lower cholesterol. Sources: Common sources include oats, beans, apples, citrus fruits, and psyllium. Benefits: Helps manage cholesterol levels. Aids in blood sugar control. Increases healthy gut bacteria, which can lower inflammation and improve digestion.  Insoluble Fiber Does Not Dissolve in Water: Insoluble fiber does not dissolve in water and remains mostly intact as it passes through the digestive system. Adds Bulk to Stool: It adds bulk to stool, which helps promote regular bowel movements and prevent constipation. Sources: Common sources include whole grains, nuts, beans, and vegetables like cauliflower and potatoes. Benefits: Improves bowel health and regularity. Reduces the risk of colorectal conditions like hemorrhoids and diverticulitis. Supports insulin sensitivity in people with diabetes.  Both types of fiber are essential for overall  health, and it's beneficial to include a 50/50 mix of both in your diet.     All of the above findings and recommendations were discussed with the patient, patient's family, and the medical team, and all of patient's and family's questions were answered to their expressed satisfaction.  I personally spent a total of 35 minutes in the care of the patient today including preparing to see the patient, getting/reviewing separately obtained history, performing a medically appropriate exam/evaluation, counseling and educating, placing orders, documenting clinical information in the EHR, and independently interpreting results.  -- Honor Leghorn M.D., [facility] 10/13/2023 9:40 AM

## 2023-10-13 NOTE — Progress Notes (Signed)
  Progress Note   Patient: Bryan Ruiz FMW:[id number] DOB: May 30, 1975 DOA: 10/11/2023     2 DOS: the patient was seen and examined on 10/13/2023   Brief hospital course: Bryan Ruiz is a 48 y.o. male with medical history significant of vasectomy, onychomycosis, who presents with nausea, vomiting, abdominal pain.  Patient also has watery stools. CT scan showed a high-grade small bowel obstruction.  NG suction was started, patient was also placed on fluids and symptomatic treatment.  General surgery consult obtained.     Principal Problem:   Small bowel obstruction (HCC) Active Problems:   Leukocytosis   Onychomycosis   Assessment and Plan:  Small bowel obstruction. Patient continues to have watery stools, likely partial small bowel obstruction.  Patient followed by general surgery, has been having loose stools.  Small bowel follow-through showed contrast in the colon.  Appears to be improving, general surgery has started on diet, will advance to soft as patient has been doing well.  Likely discharge home tomorrow.   Leukocytosis has resolved.   Hyponatremia. Resolved after IV fluids      Subjective:  Some loose stools, no abdominal pain or nausea vomiting  Physical Exam: Vitals:   10/12/23 1527 10/12/23 1957 10/13/23 0409 10/13/23 0759  BP: 124/78 123/82 119/75 130/82  Pulse: 86 96 94 86  Resp: 16 18 18 16   Temp: 98.7 F (37.1 C) 99 F (37.2 C) 98.6 F (37 C) 99 F (37.2 C)  TempSrc: Oral     SpO2: 98% 99% 100% 99%  Weight:      Height:       General exam: Appears calm and comfortable  Respiratory system: Clear to auscultation. Respiratory effort normal. Cardiovascular system: S1 & S2 heard, RRR. No JVD, murmurs, rubs, gallops or clicks. No pedal edema. Gastrointestinal system: Abdomen is nondistended, soft and nontender. No organomegaly or masses felt. Normal bowel sounds heard. Central nervous system: Alert and oriented. No focal neurological  deficits. Extremities: Symmetric 5 x 5 power. Skin: No rashes, lesions or ulcers Psychiatry: Judgement and insight appear normal. Mood & affect appropriate.    Data Reviewed:  X-ray and lab results reviewed  Family Communication: None  Disposition: Status is: Inpatient Remains inpatient appropriate because: Severity of disease.     Time spent: 35 minutes  Author: Murvin Mana, MD 10/13/2023 1:18 PM  For on call review [email or web address].

## 2023-10-13 NOTE — Discharge Instructions (Addendum)
 - Advised to pursue a goal of 25 to 30 g of fiber daily.  The majority of this may be through natural sources, advised to ensure a minimal daily fiber supplementation.  Various forms of supplements discussed.  Recommend Psyllium husk, with options of mixing with beverage or applesauce to make more tolerable. Strongly advised to consume more fluids(especially in proximity to fiber intake) and to ensure adequate hydration.   Watch color of urine to determine adequacy of hydration.  Clarity is pursued in urine output, and bowel activity that responds and corresponds to significant meal intake.   We need to avoid deferring having bowel movements, advised to take the time at the first sign of sensation, typically following meals, and in the morning.  The need to avoid more frequently, and the presence of flatus may indicate the need for bowel movement.  Do not defer for later.  Addition of MiraLAX  (or its generic equivalent) may be needed ensure at least twice daily bowel movements.  If multiple doses of MiraLAX  are necessary utilize them. Never skip a day... Do not tolerate a day without a bowel movement unless you are fasting.  To be regular, we must do the above EVERY day.  Bowel Obstruction A bowel obstruction means that something is blocking the small or large bowel. The bowel is also called the intestine. It is the long tube that connects the stomach to the opening of the butt (anus). When something blocks the bowel, food and fluids cannot pass through like normal. This condition needs to be treated. Treatment depends on the cause of the problem and how bad the problem is. What are the causes? Common causes of this condition include: Scar tissue inside the body from past surgery or from high-energy X-rays (radiation). Recent surgery in the belly. This affects how food moves in the bowel. Some diseases, such as: Irritation of the lining of the digestive tract (Crohn's disease). Irritation of small  pouches in the bowel (diverticulitis). Growths or tumors. A bulging organ or tissue (hernia). Twisting of the bowel (volvulus). A swallowed object. Slipping of a part of the bowel into another part (intussusception). What are the signs or symptoms? Symptoms of this condition include: Pain in the belly. Feeling like you may vomit (nauseous). Vomiting. Bloating in the belly. Being unable to pass gas. Trouble pooping (constipation). A lot of belching. Watery poop (diarrhea). How is this treated? Treatment for this condition may include: Fluids and pain medicines that are given through an IV tube. Your doctor may tell you not to eat or drink if you feel like you may vomit or are vomiting. Eating a clear liquid diet for a few days. Putting a small tube (nasogastric tube) through the nose, down the throat, and into the stomach. This will help with pain, discomfort, and the feeling like you may vomit. Surgery. This may be needed if other treatments do not work. Follow these instructions at home: Medicines Take over-the-counter and prescription medicines only as told by your doctor. If you were prescribed an antibiotic medicine, take it as told by your doctor. Do not stop taking the antibiotic even if you start to feel better. General instructions Follow instructions from your doctor about what you can or cannot eat and drink. You may need to: Only drink clear liquids until you start to get better. Avoid solid foods. Return to your normal activities when your doctor says that it is safe. Rest as told by your doctor. Get up to take short walks  every 1 to 2 hours. Ask for help if you feel weak or unsteady. Keep all follow-up visits. How is this prevented? After having a bowel obstruction, you may be more likely to have another. You can do some things to stop it from happening again. If you have a long-term (chronic) disease, contact your doctor if you see changes or problems. Avoid having  trouble pooping. You may need to take these actions to prevent or treat trouble pooping: Drink enough fluid to keep your pee (urine) pale yellow. Take over-the-counter or prescription medicines. Eat foods that are high in fiber. These include beans, whole grains, and fresh fruits and vegetables. Limit foods that are high in fat and sugar. These include fried or sweet foods. Stay active. Ask your doctor which exercises are safe for you. Avoid stress. Eat three small meals and three small snacks each day. Work with a diet and Hospital doctor (dietitian) to make a meal plan that works for you. Do not smoke or use any products that contain nicotine or tobacco. If you need help quitting, ask your doctor. Contact a doctor if: You have a fever. You have chills. Get help right away if: You have pain or cramps that get worse. You vomit blood. You feel like you may vomit and the feeling lasts a long time. You cannot stop vomiting. You cannot drink fluids. You feel confused. You feel very thirsty (dehydrated). Your belly gets more bloated. You feel weak or you faint. Summary A bowel obstruction means that something is blocking the small or large bowel. Treatment may include IV fluids and pain medicine. You may also have a clear liquid diet, a small tube in your stomach, or surgery. Drink clear liquids and avoid solid foods until you get better, as told by your doctor. This information is not intended to replace advice given to you by your health care provider. Make sure you discuss any questions you have with your health care provider.  Soluble Fiber Dissolves in Water: Soluble fiber dissolves in water to form a gel-like substance. Slows Digestion: This type of fiber slows down digestion, which can help control blood sugar levels and lower cholesterol. Sources: Common sources include oats, beans, apples, citrus fruits, and psyllium. Benefits: Helps manage cholesterol levels. Aids in blood  sugar control. Increases healthy gut bacteria, which can lower inflammation and improve digestion.  Insoluble Fiber Does Not Dissolve in Water: Insoluble fiber does not dissolve in water and remains mostly intact as it passes through the digestive system. Adds Bulk to Stool: It adds bulk to stool, which helps promote regular bowel movements and prevent constipation. Sources: Common sources include whole grains, nuts, beans, and vegetables like cauliflower and potatoes. Benefits: Improves bowel health and regularity. Reduces the risk of colorectal conditions like hemorrhoids and diverticulitis. Supports insulin sensitivity in people with diabetes.  Both types of fiber are essential for overall health, and it's beneficial to include a 50/50 mix of both in your diet.

## 2023-10-14 DIAGNOSIS — E871 Hypo-osmolality and hyponatremia: Secondary | ICD-10-CM | POA: Diagnosis not present

## 2023-10-14 DIAGNOSIS — K56600 Partial intestinal obstruction, unspecified as to cause: Secondary | ICD-10-CM | POA: Diagnosis not present

## 2023-10-14 DIAGNOSIS — K56609 Unspecified intestinal obstruction, unspecified as to partial versus complete obstruction: Secondary | ICD-10-CM | POA: Diagnosis not present

## 2023-10-14 LAB — BASIC METABOLIC PANEL WITH GFR
Anion gap: 10 (ref 5–15)
BUN: 10 mg/dL (ref 6–20)
CO2: 26 mmol/L (ref 22–32)
Calcium: 8.5 mg/dL — ABNORMAL LOW (ref 8.9–10.3)
Chloride: 102 mmol/L (ref 98–111)
Creatinine, Ser: 1 mg/dL (ref 0.61–1.24)
GFR, Estimated: >60 mL/min (ref >60)
Glucose, Bld: 107 mg/dL — ABNORMAL HIGH (ref 70–99)
Potassium: 3.5 mmol/L (ref 3.5–5.1)
Sodium: 138 mmol/L (ref 135–145)

## 2023-10-14 LAB — GLUCOSE, CAPILLARY: Glucose-Capillary: 94 mg/dL (ref 70–99)

## 2023-10-14 LAB — MAGNESIUM: Magnesium: 2.1 mg/dL (ref 1.7–2.4)

## 2023-10-14 MED ORDER — POLYETHYLENE GLYCOL 3350 17 GM/SCOOP PO POWD: 17.0000 g | Freq: Two times a day (BID) | ORAL | 0 refills | Status: AC | PRN

## 2023-10-14 MED ORDER — POTASSIUM CHLORIDE CRYS ER 20 MEQ PO TBCR
40.0000 meq | EXTENDED_RELEASE_TABLET | Freq: Once | ORAL | Status: AC
Start: 2023-10-14 — End: 2023-10-14
  Administered 2023-10-14 (×2): 40 meq via ORAL
  Filled 2023-10-14: qty 2

## 2023-10-14 NOTE — Discharge Summary (Signed)
 Physician Discharge Summary   Patient: Bryan Ruiz MRN: [id number] DOB: 1976-01-09  Admit date:     10/11/2023  Discharge date: 10/14/23  Discharge Physician: Murvin Mana   PCP: Oris Camie BRAVO, NP   Recommendations at discharge:   PCP in 1 week. Follow-up with general surgery in 2 weeks  Discharge Diagnoses: Principal Problem:   Small bowel obstruction (HCC) Active Problems:   Leukocytosis   Onychomycosis  Resolved Problems:   * No resolved hospital problems. *  Hospital Course: [name] is a 48 y.o. male with medical history significant of vasectomy, onychomycosis, who presents with nausea, vomiting, abdominal pain.  Patient also has watery stools. CT scan showed a high-grade small bowel obstruction.  NG suction was started, patient was also placed on fluids and symptomatic treatment.  General surgery consult obtained. Condition improved, has multiple bowel movements, tolerating diet.  Medically stable for discharge    Assessment and Plan: Small bowel obstruction. Patient continues to have watery stools, likely partial small bowel obstruction.  Patient followed by general surgery, has been having loose stools.  Small bowel follow-through showed contrast in the colon.  Appears to be improving, general surgery has started on diet, tolerating soft diet since yesterday. Medically stable for discharge.   Leukocytosis has resolved.   Hyponatremia. Resolved after IV fluids         Consultants: General surgery Procedures performed: None  Disposition: Home Diet recommendation:  Cardiac diet DISCHARGE MEDICATION: Allergies as of 10/14/2023   No Known Allergies      Medication List     STOP taking these medications    fluconazole  150 MG tablet Commonly known as: DIFLUCAN    hydrocortisone  25 MG suppository Commonly known as: ANUSOL -HC       TAKE these medications    polyethylene glycol powder 17 GM/SCOOP powder Commonly known as:  GLYCOLAX /MIRALAX  Take 17 g by mouth 2 (two) times daily as needed.        Follow-up Information     Lane Shope, MD Follow up in 2 week(s).   Specialty: General Surgery Contact information: [address] [place] [address] [phone]         Oris Camie BRAVO, NP Follow up in 1 week(s).   Specialty: Nurse Practitioner Contact information: [address] [place] [address] [phone]                Discharge Exam: [name]   10/12/23 0055  Weight: 69.8 kg   General exam: Appears calm and comfortable  Respiratory system: Clear to auscultation. Respiratory effort normal. Cardiovascular system: S1 & S2 heard, RRR. No JVD, murmurs, rubs, gallops or clicks. No pedal edema. Gastrointestinal system: Abdomen is nondistended, soft and nontender. No organomegaly or masses felt. Normal bowel sounds heard. Central nervous system: Alert and oriented. No focal neurological deficits. Extremities: Symmetric 5 x 5 power. Skin: No rashes, lesions or ulcers Psychiatry: Judgement and insight appear normal. Mood & affect appropriate.    Condition at discharge: good  The results of significant diagnostics from this hospitalization (including imaging, microbiology, ancillary and laboratory) are listed below for reference.   Imaging Studies: DG Abd 1 View Result Date: 10/13/2023 CLINICAL DATA:  [id number] SBO (small bowel obstruction) (HCC) [id number] EXAM: ABDOMEN - 1 VIEW COMPARISON:  None Available. FINDINGS: Esophagogastric tube is coiled in the stomach, similarly positioned. A couple of persistently dilated and gas-filled segments of small bowel noted in the mid to right abdomen measuring up to 3.4 cm. Enteric contrast throughout  the colon. No pneumoperitoneum. No organomegaly or radiopaque calculi. No acute fracture or destructive lesion. The lung bases are clear. IMPRESSION: A couple of persistently dilated and gas-filled segments of small bowel again  noted in the mid to right abdomen measuring up to 3.4 cm. Enteric contrast noted in the colon, suggesting changes of either a partial or resolving small bowel obstruction. Electronically Signed   By: Rogelia Myers M.D.   On: 10/13/2023 12:19   DG Abd Portable 1V-Small Bowel Obstruction Protocol-initial, 8 hr delay Result Date: 10/13/2023 CLINICAL DATA:  Small-bowel obstruction, post 8 hour imaging EXAM: PORTABLE ABDOMEN - 1 VIEW COMPARISON:  1:49 p.m. FINDINGS: Nasogastric tube is seen looped within the gastric fundus. Administered oral contrast is seen within the gastric fundus and now is seen throughout a nondistended colon. Multiple gas-filled dilated loops are of small bowel are again seen within the mid abdomen and, together, findings are in keeping with a mid to distal partial or resolving complete small bowel obstruction. No free intraperitoneal gas. No acute bone abnormality. IMPRESSION: 1. Mid to distal partial or resolving complete small bowel obstruction. Administered contrast opacifies a nondistended colon. 2. Nasogastric tube looped within the gastric fundus. Electronically Signed   By: Dorethia Molt M.D.   On: 10/13/2023 00:21   DG Abd Portable 1V-Small Bowel Protocol-Position Verification Result Date: 10/12/2023 CLINICAL DATA:  Nasogastric tube placement. EXAM: PORTABLE ABDOMEN - 1 VIEW COMPARISON:  Radiograph and CT earlier today FINDINGS: Tip and side port of the enteric tube below the diaphragm in the stomach. Dilated bowel loops on earlier imaging are not included in the field of view. IMPRESSION: Tip and side port of the enteric tube below the diaphragm in the stomach. Electronically Signed   By: Andrea Gasman M.D.   On: 10/12/2023 14:49   DG Abd 1 View Result Date: 10/12/2023 CLINICAL DATA:  Small bowel obstruction. EXAM: ABDOMEN - 1 VIEW COMPARISON:  10/12/2023 FINDINGS: NG tube tip is just below the GE junction. Proximal side port of the NG tube is in the distal esophagus.  Diffuse gaseous small bowel distension appears progressive since prior x-ray and comparing to scout film from CT 10/11/2023. There is some visible gas in the transverse colon. Contrast material in the bladder lumen is compatible with the recent CT. IMPRESSION: 1. NG tube has pulled back in the interval with the tip now just below the GE junction and proximal side port in the distal esophagus. Tube could be advanced approximately 8 cm to place the proximal side port below the GE junction. Repeat x-ray after repositioning recommended. 2. Interval progression of diffuse gaseous small bowel distension. Electronically Signed   By: Camellia Candle M.D.   On: 10/12/2023 09:01   DG Abd Portable 1V Result Date: 10/12/2023 CLINICAL DATA:  Check gastric catheter placement EXAM: PORTABLE ABDOMEN - 1 VIEW COMPARISON:  None Available. FINDINGS: Nasogastric catheter extends into the stomach. Proximal side port lies adjacent to the gastroesophageal junction. This could be advanced deeper into the stomach. Multiple small bowel dilated loops are seen. No free air is noted. IMPRESSION: Gastric catheter as described. This could be advanced slightly deeper into the stomach. Electronically Signed   By: Oneil Devonshire M.D.   On: 10/12/2023 00:50   CT ABDOMEN PELVIS W CONTRAST Result Date: 10/11/2023 CLINICAL DATA:  Abdominal pain, acute, nonlocalizedpt presents via POV c/o lower abd pain since yesterday. Report N/V/D. Reports took gas pill without relief. EXAM: CT ABDOMEN AND PELVIS WITH CONTRAST TECHNIQUE: Multidetector CT imaging  of the abdomen and pelvis was performed using the standard protocol following bolus administration of intravenous contrast. RADIATION DOSE REDUCTION: This exam was performed according to the departmental dose-optimization program which includes automated exposure control, adjustment of the mA and/or kV according to patient size and/or use of iterative reconstruction technique. CONTRAST:  100mL OMNIPAQUE   IOHEXOL  300 MG/ML  SOLN COMPARISON:  None Available. FINDINGS: Lower chest: No acute abnormality. Hepatobiliary: Subcentimeter pericentimeter hypodensities likely represent simple hepatic cysts. No gallstones, gallbladder wall thickening, or pericholecystic fluid. No biliary dilatation. Pancreas: No focal lesion. Normal pancreatic contour. No surrounding inflammatory changes. No main pancreatic ductal dilatation. Spleen:  Normal in size without focal abnormality. Adrenals/Urinary Tract: No adrenal nodule bilaterally. Bilateral kidneys enhance symmetrically. No hydronephrosis. No hydroureter. The urinary bladder is unremarkable. Stomach/Bowel: Stomach is within dilated with fluid. The majority of the proximal mid small bowel is dilated with fluid with query transition point within the mid abdomen (2:44, 5: 24). Distal ileum is mostly decompressed. The colon is mostly decompressed. No evidence of large bowel wall thickening or dilatation. Appendix appears normal. Vascular/Lymphatic: No abdominal aorta or iliac aneurysm. No abdominal, pelvic, or inguinal lymphadenopathy. Reproductive: Prostate is unremarkable. Other: No intraperitoneal free fluid. No intraperitoneal free gas. No organized fluid collection. Musculoskeletal: No abdominal wall hernia or abnormality. No suspicious lytic or blastic osseous lesions. No acute displaced fracture. IMPRESSION: High-grade small-bowel obstruction with query transition point within the mid abdomen. Electronically Signed   By: Morgane  Naveau M.D.   On: 10/11/2023 22:24    Microbiology: Results for orders placed or performed in visit on 10/23/15  GC/Chlamydia Probe Amp     Status: None   Collection Time: 10/23/15  9:33 AM   Specimen: Urine  Result Value Ref Range Status   CT Probe RNA NOT DETECTED  Final    Comment:                    **Normal Reference Range: NOT DETECTED**   This test was performed using the APTIMA COMBO2 Assay ([employer]   The analytical  performance characteristics of this assay, when used to test Sure[facility] specimens have been determined by [facility]      GC Probe RNA NOT DETECTED  Final    Comment:                    **Normal Reference Range: NOT DETECTED**   This test was performed using the APTIMA COMBO2 Assay ([employer]   The analytical performance characteristics of this assay, when used to test [facility] specimens have been determined by [facility]       Labs: CBC: Recent Labs  Lab 10/11/23 1949 10/12/23 0420  WBC 11.7* 8.8  HGB 17.0 14.5  HCT 48.6 42.4  MCV 83.8 86.0  PLT 235 203   Basic Metabolic Panel: Recent Labs  Lab 10/11/23 1949 10/12/23 0420 10/13/23 0503 10/14/23 0349  NA 133* 136 141 138  K 3.9 3.7 3.8 3.5  CL 97* 104 111 102  CO2 24 25 24 26   GLUCOSE 120* 118* 95 107*  BUN 16 16 17 10   CREATININE 1.10 0.86 1.06 1.00  CALCIUM 9.7 8.2* 8.0* 8.5*  MG  --   --  2.0 2.1   Liver Function Tests: Recent Labs  Lab 10/11/23 1949  AST 22  ALT 19  ALKPHOS 32*  BILITOT 2.4*  PROT 8.8*  ALBUMIN 4.8   CBG: Recent Labs  Lab 10/12/23 0911 10/13/23 0804 10/13/23  1120 10/14/23 0740  GLUCAP 90 84 140* 94    Discharge time spent: greater than 30 minutes.  Signed: Murvin Mana, MD [facility] 10/14/2023

## 2023-10-14 NOTE — Progress Notes (Signed)
 CC: SBO Subjective: Feeling well, some soreness, tolerated dinner ( salmon) + flatus, no N/V. Already ordered oatmeal and omelette for breakfast KUB pers reviewed w contrast in colon  Objective: Vital signs in last 24 hours: Temp:  [98.6 F (37 C)-99.7 F (37.6 C)] 98.6 F (37 C) (08/12 0739) Pulse Rate:  [78-94] 78 (08/12 0739) Resp:  [16] 16 (08/12 0739) BP: (109-130)/(67-82) 114/78 (08/12 0739) SpO2:  [96 %-99 %] 96 % (08/12 0739) Last BM Date : 10/13/23  Intake/Output from previous day: 08/11 0701 - 08/12 0700 In: 270.1 [I.V.:270.1] Out: 100 [Emesis/NG output:100] Intake/Output this shift: No intake/output data recorded.  Physical exam:  NAD  Abd: soft, NT no rebound or peritonitis Ext: well perfused w/o edema   Lab Results: CBC  Recent Labs    10/11/23 1949 10/12/23 0420  WBC 11.7* 8.8  HGB 17.0 14.5  HCT 48.6 42.4  PLT 235 203   BMET Recent Labs    10/13/23 0503 10/14/23 0349  NA 141 138  K 3.8 3.5  CL 111 102  CO2 24 26  GLUCOSE 95 107*  BUN 17 10  CREATININE 1.06 1.00  CALCIUM 8.0* 8.5*   PT/INR Recent Labs    10/12/23 0112  LABPROT 14.4  INR 1.1   ABG No results for input(s): PHART, HCO3 in the last 72 hours.  Invalid input(s): PCO2, PO2  Studies/Results: DG Abd 1 View Result Date: 10/13/2023 CLINICAL DATA:  [id number] SBO (small bowel obstruction) (HCC) [id number] EXAM: ABDOMEN - 1 VIEW COMPARISON:  None Available. FINDINGS: Esophagogastric tube is coiled in the stomach, similarly positioned. A couple of persistently dilated and gas-filled segments of small bowel noted in the mid to right abdomen measuring up to 3.4 cm. Enteric contrast throughout the colon. No pneumoperitoneum. No organomegaly or radiopaque calculi. No acute fracture or destructive lesion. The lung bases are clear. IMPRESSION: A couple of persistently dilated and gas-filled segments of small bowel again noted in the mid to right abdomen measuring up to 3.4 cm. Enteric  contrast noted in the colon, suggesting changes of either a partial or resolving small bowel obstruction. Electronically Signed   By: Rogelia Myers M.D.   On: 10/13/2023 12:19   DG Abd Portable 1V-Small Bowel Obstruction Protocol-initial, 8 hr delay Result Date: 10/13/2023 CLINICAL DATA:  Small-bowel obstruction, post 8 hour imaging EXAM: PORTABLE ABDOMEN - 1 VIEW COMPARISON:  1:49 p.m. FINDINGS: Nasogastric tube is seen looped within the gastric fundus. Administered oral contrast is seen within the gastric fundus and now is seen throughout a nondistended colon. Multiple gas-filled dilated loops are of small bowel are again seen within the mid abdomen and, together, findings are in keeping with a mid to distal partial or resolving complete small bowel obstruction. No free intraperitoneal gas. No acute bone abnormality. IMPRESSION: 1. Mid to distal partial or resolving complete small bowel obstruction. Administered contrast opacifies a nondistended colon. 2. Nasogastric tube looped within the gastric fundus. Electronically Signed   By: Dorethia Molt M.D.   On: 10/13/2023 00:21   DG Abd Portable 1V-Small Bowel Protocol-Position Verification Result Date: 10/12/2023 CLINICAL DATA:  Nasogastric tube placement. EXAM: PORTABLE ABDOMEN - 1 VIEW COMPARISON:  Radiograph and CT earlier today FINDINGS: Tip and side port of the enteric tube below the diaphragm in the stomach. Dilated bowel loops on earlier imaging are not included in the field of view. IMPRESSION: Tip and side port of the enteric tube below the diaphragm in the stomach. Electronically Signed   By: Andrea  Sanford M.D.   On: 10/12/2023 14:49   DG Abd 1 View Result Date: 10/12/2023 CLINICAL DATA:  Small bowel obstruction. EXAM: ABDOMEN - 1 VIEW COMPARISON:  10/12/2023 FINDINGS: NG tube tip is just below the GE junction. Proximal side port of the NG tube is in the distal esophagus. Diffuse gaseous small bowel distension appears progressive since prior  x-ray and comparing to scout film from CT 10/11/2023. There is some visible gas in the transverse colon. Contrast material in the bladder lumen is compatible with the recent CT. IMPRESSION: 1. NG tube has pulled back in the interval with the tip now just below the GE junction and proximal side port in the distal esophagus. Tube could be advanced approximately 8 cm to place the proximal side port below the GE junction. Repeat x-ray after repositioning recommended. 2. Interval progression of diffuse gaseous small bowel distension. Electronically Signed   By: Camellia Candle M.D.   On: 10/12/2023 09:01    Anti-infectives: Anti-infectives (From admission, onward)    None       Assessment/Plan: Partial SBO on virgin abdomen now resolving NO need for surgical intervention I do think he can be DC if tolerated breakfast We will follow as outpt All questions answered I personally spent a total of 35 minutes in the care of the patient today including performing a medically appropriate exam/evaluation, counseling and educating, placing orders, referring and communicating with other health care professionals, documenting clinical information in the EHR, independently interpreting and reviewing images studies and coordinating care.     Laneta Luna, MD, [facility]  10/14/2023

## 2023-10-15 ENCOUNTER — Telehealth: Payer: Self-pay

## 2023-10-15 NOTE — Transitions of Care (Post Inpatient/ED Visit) (Signed)
   10/15/2023  Name: Bryan Ruiz MRN: [id number] DOB: December 02, 1975  Today's TOC FU Call Status: Today's TOC FU Call Status:: Successful TOC FU Call Completed TOC FU Call Complete Date: 10/15/23 Patient's Name and Date of Birth confirmed.  Transition Care Management Follow-up Telephone Call Date of Discharge: 10/14/23 Discharge Facility: [facility]) Type of Discharge: Inpatient Admission Primary Inpatient Discharge Diagnosis:: intestional obstruction How have you been since you were released from the hospital?: Better Any questions or concerns?: No  Items Reviewed: Did you receive and understand the discharge instructions provided?: Yes Medications obtained,verified, and reconciled?: Yes (Medications Reviewed) Any new allergies since your discharge?: No Dietary orders reviewed?: Yes Do you have support at home?: Yes People in Home [RPT]: parent(s), significant other  Medications Reviewed Today: Medications Reviewed Today     Reviewed by Emmitt Pan, LPN (Licensed Practical Nurse) on 10/15/23 at 1126  Med List Status: <None>   Medication Order Taking? Sig Documenting Provider Last Dose Status Informant  polyethylene glycol powder (GLYCOLAX /MIRALAX ) 17 GM/SCOOP powder [id number] Yes Take 17 g by mouth 2 (two) times daily as needed. Laurita Pillion, MD  Active             Home Care and Equipment/Supplies: Were Home Health Services Ordered?: NA Any new equipment or medical supplies ordered?: NA  Functional Questionnaire: Do you need assistance with bathing/showering or dressing?: No Do you need assistance with meal preparation?: No Do you need assistance with eating?: No Do you have difficulty maintaining continence: No Do you need assistance with getting out of bed/getting out of a chair/moving?: No Do you have difficulty managing or taking your medications?: No  Follow up appointments reviewed: PCP Follow-up appointment confirmed?:  Yes Date of PCP follow-up appointment?: 10/27/23 Follow-up Provider: [facility] Follow-up appointment confirmed?: No Reason Specialist Follow-Up Not Confirmed: Patient has Specialist Provider Number and will Call for Appointment Do you need transportation to your follow-up appointment?: No Do you understand care options if your condition(s) worsen?: Yes-patient verbalized understanding    SIGNATURE Pan Emmitt, LPN [facility] Nurse Health Advisor Direct Dial [phone]

## 2023-10-21 ENCOUNTER — Inpatient Hospital Stay: Admitting: Medical

## 2023-10-27 ENCOUNTER — Ambulatory Visit (INDEPENDENT_AMBULATORY_CARE_PROVIDER_SITE_OTHER): Admitting: Nurse Practitioner

## 2023-10-27 ENCOUNTER — Encounter: Payer: Self-pay | Admitting: Nurse Practitioner

## 2023-10-27 VITALS — BP 122/82 | HR 80 | Wt 153.4 lb

## 2023-10-27 DIAGNOSIS — K56609 Unspecified intestinal obstruction, unspecified as to partial versus complete obstruction: Secondary | ICD-10-CM

## 2023-10-27 NOTE — Assessment & Plan Note (Addendum)
 No recurrent symptoms present at this time. He is having normal bowel movements with daily miralax  and benefiber. We discussed ongoing use for management. If symptoms of constipation arise, recommend increasing the miralax  dose to twice a day and holding the fiber until he has a bowel movement. If he goes more than 4 days without a bowel movement, I recommend evaluation. He can slowly increase his diet as tolerated at this time. Keep follow-up tomorrow.

## 2023-10-27 NOTE — Patient Instructions (Addendum)
 I do recommend continuing with the miralax  daily and keep using the benefiber. These are both great to help keep things moving. If you feel like you are getting constipated, please hold the extra fiber and increase the miralax  to twice a day to see if this helps. If no bowel movement in 4-5 days, please call the office.   Constipation, Adult Constipation is when a person has fewer than three bowel movements in a week, has difficulty having a bowel movement, or has stools (feces) that are dry, hard, or larger than normal. Constipation may be caused by an underlying condition. It may become worse with age if a person takes certain medicines and does not take in enough fluids. Follow these instructions at home: Eating and drinking  Eat foods that have a lot of fiber, such as beans, whole grains, and fresh fruits and vegetables. Limit foods that are low in fiber and high in fat and processed sugars, such as fried or sweet foods. These include french fries, hamburgers, cookies, candies, and soda. Drink enough fluid to keep your urine pale yellow. General instructions Exercise regularly or as told by your health care provider. Try to do 150 minutes of moderate exercise each week. Use the bathroom when you have the urge to go. Do not hold it in. Take over-the-counter and prescription medicines only as told by your health care provider. This includes any fiber supplements. During bowel movements: Practice deep breathing while relaxing the lower abdomen. Practice pelvic floor relaxation. Watch your condition for any changes. Let your health care provider know about them. Keep all follow-up visits as told by your health care provider. This is important. Contact a health care provider if: You have pain that gets worse. You have a fever. You do not have a bowel movement after 4 days. You vomit. You are not hungry or you lose weight. You are bleeding from the opening between the buttocks (anus). You  have thin, pencil-like stools. Get help right away if: You have a fever and your symptoms suddenly get worse. You leak stool or have blood in your stool. Your abdomen is bloated. You have severe pain in your abdomen. You feel dizzy or you faint. Summary Constipation is when a person has fewer than three bowel movements in a week, has difficulty having a bowel movement, or has stools (feces) that are dry, hard, or larger than normal. Eat foods that have a lot of fiber, such as beans, whole grains, and fresh fruits and vegetables. Drink enough fluid to keep your urine pale yellow. Take over-the-counter and prescription medicines only as told by your health care provider. This includes any fiber supplements. This information is not intended to replace advice given to you by your health care provider. Make sure you discuss any questions you have with your health care provider. Document Revised: 01/02/2022 Document Reviewed: 01/02/2022 Elsevier Patient Education  2024 [employer].

## 2023-10-27 NOTE — Progress Notes (Signed)
  Catheline Doing, DNP, AGNP-c [facility] [address] [place], [place] [address] Main Office [phone]  ESTABLISHED PATIENT- Hospital Follow-Up Visit on 10/27/2023  Blood pressure 122/82, pulse 80, weight 153 lb 6.4 oz (69.6 kg).  HPI  Bryan Ruiz  is a 48 y.o. year old male presenting today for evaluation and management following hospitalization.   Date of Hospitalization: 08/09-08/02/2024 Admitted: Yes Primary Diagnosis: Small bowel obstruction New Medications: Yes: Miralax  D/C'd Chronic Medications: No New Providers: Yes: General surgery Speciality F/U Scheduled: Yes Lab/Imaging Concerns that need further evaluation: No   Bryan Ruiz was seen for constipation with liquid stool, nausea, vomiting and diagnosed with a SBO. He was admitted and NG to [facility]WS was initiated. General surgery was consulted, but his symptoms resolved without further intervention.  He follows up with general surgery tomorrow.   Since d/c he reports having a BM about every other day. He is taking miralax , one dose, daily and benefiber. He is staying well hydrated and watching his diet. He denies abdominal pain, nausea, vomiting, loose stools.   ROS All ROS negative with exception of what is listed in HPI  PHYSICAL EXAM Physical Exam Vitals and nursing note reviewed.  Constitutional:      Appearance: Normal appearance. He is normal weight.  Neck:     Vascular: No carotid bruit.  Cardiovascular:     Rate and Rhythm: Normal rate and regular rhythm.     Pulses: Normal pulses.     Heart sounds: Normal heart sounds.  Pulmonary:     Effort: Pulmonary effort is normal.     Breath sounds: Normal breath sounds.  Abdominal:     General: Abdomen is flat. Bowel sounds are normal. There is no distension.     Palpations: Abdomen is soft.     Tenderness: There is no abdominal tenderness. There is no guarding or rebound.  Musculoskeletal:     Right lower leg: No edema.     Left lower leg: No  edema.  Skin:    General: Skin is warm and dry.     Capillary Refill: Capillary refill takes less than 2 seconds.  Neurological:     General: No focal deficit present.     Mental Status: He is alert and oriented to person, place, and time.     Motor: No weakness.  Psychiatric:        Mood and Affect: Mood normal.        Behavior: Behavior normal.      ASSESSMENT & PLAN Problem List Items Addressed This Visit     Small bowel obstruction (HCC) - Primary   No recurrent symptoms present at this time. He is having normal bowel movements with daily miralax  and benefiber. We discussed ongoing use for management. If symptoms of constipation arise, recommend increasing the miralax  dose to twice a day and holding the fiber until he has a bowel movement. If he goes more than 4 days without a bowel movement, I recommend evaluation. He can slowly increase his diet as tolerated at this time. Keep follow-up tomorrow.          FOLLOW-UP   SaraBeth Philopateer Strine, DNP, AGNP-c Time: 28 minutes, >50% spent counseling, care coordination, chart review, and documentation.    History, Medications, Surgery, SDOH, and Family History reviewed and updated as appropriate.  Health Maintenance Schedule Health Maintenance reviewed .

## 2023-10-28 ENCOUNTER — Encounter: Payer: Self-pay | Admitting: Surgery

## 2023-10-28 ENCOUNTER — Ambulatory Visit (INDEPENDENT_AMBULATORY_CARE_PROVIDER_SITE_OTHER): Admitting: Surgery

## 2023-10-28 VITALS — BP 105/67 | HR 83 | Temp 98.4°F | Ht 70.0 in | Wt 151.6 lb

## 2023-10-28 DIAGNOSIS — K5900 Constipation, unspecified: Secondary | ICD-10-CM | POA: Insufficient documentation

## 2023-10-28 DIAGNOSIS — Z8719 Personal history of other diseases of the digestive system: Secondary | ICD-10-CM | POA: Diagnosis not present

## 2023-10-28 NOTE — Patient Instructions (Addendum)
 Advised to pursue a goal of 25 to 30 g of fiber daily.  The majority of this may be through natural sources, advised to ensure a minimal daily fiber supplementation.  Various forms of supplements discussed.  Recommend Psyllium husk, with options of mixing with beverage or applesauce to make more tolerable. Strongly advised to consume more fluids(especially in proximity to fiber intake) and to ensure adequate hydration.   Watch color of urine to determine adequacy of hydration.  Clarity is pursued in urine output, and bowel activity that responds and corresponds to significant meal intake.   We need to avoid deferring having bowel movements, advised to take the time at the first sign of sensation, typically following meals, and in the morning.  The need to avoid more frequently, and the presence of flatus may indicate the need for bowel movement.  Do not defer for later.  Addition of MiraLAX  (or its generic equivalent) may be needed ensure at least twice daily bowel movements.  If multiple doses of MiraLAX  are necessary utilize them. Never skip a day... Do not tolerate a day without a bowel movement unless you are fasting.  To be regular, we must do the above EVERY day.   Soluble Fiber Dissolves in Water: Soluble fiber dissolves in water to form a gel-like substance. Slows Digestion: This type of fiber slows down digestion, which can help control blood sugar levels and lower cholesterol. Sources: Common sources include oats, beans, apples, citrus fruits, and psyllium. Benefits: Helps manage cholesterol levels. Aids in blood sugar control. Increases healthy gut bacteria, which can lower inflammation and improve digestion.  Insoluble Fiber Does Not Dissolve in Water: Insoluble fiber does not dissolve in water and remains mostly intact as it passes through the digestive system. Adds Bulk to Stool: It adds bulk to stool, which helps promote regular bowel movements and prevent constipation. Sources:  Common sources include whole grains, nuts, beans, and vegetables like cauliflower and potatoes. Benefits: Improves bowel health and regularity. Reduces the risk of colorectal conditions like hemorrhoids and diverticulitis. Supports insulin sensitivity in people with diabetes.  Both types of fiber are essential for overall health, and it's beneficial to include a 50/50 mix of both in your diet.    Small Bowel Series  A small bowel series is an X-ray test. It is used to check for problems in the small bowel. The small bowel is also called the small intestine. For this test, you will drink a liquid called barium. The liquid (contrast liquid) shows up well on X-rays. This makes it easier for your doctor to see any problems. The test can help to find out why you have symptoms such as: Belly (abdominal) pain. Bloating. Watery poop (diarrhea). Tell your doctor about: Any allergies you have, especially allergies to liquids used in imaging tests. All medicines you are taking. This includes vitamins, herbs, eye drops, creams, and over-the-counter medicines. Any blood disorders you have. Any surgeries you have had. Any medical conditions you have. Whether you are pregnant or may be pregnant. Whether you are breastfeeding. What are the risks? Usually, this is a safe test. However, problems may happen, such as: Feeling like you may vomit (nauseous) after drinking the contrast liquid. Cramps. Trouble pooping (constipation). A blockage in your small bowel getting worse. Exposure to a very small amount of radiation. Allergic reaction to the contrast liquid. This is rare. What happens before the test? Follow instructions from your doctor about what you cannot eat or drink. Ask your doctor about  changing or stopping: Your normal medicines. Vitamins, herbs, and supplements. Over-the-counter medicines. What happens during the test? You will drink the contrast liquid. It looks like a  milkshake. Using a type of X-ray, the doctor will watch the contrast liquid as it moves through: The part of your body that moves food from your mouth to your stomach (esophagus). Your stomach. Your small bowel. Plain X-ray pictures will be taken often as the contrast liquid moves through your small bowel. These may be taken every 15-60 minutes. You may need to change positions often so the doctor can see all of the small bowel. You may need to stand up or lie on a table. The table may move or tilt. You may need to turn from side to side. The procedure may vary among doctors and hospitals. What can I expect after the test? Your poop (stool) may be white or gray for 2-3 days until all the contrast liquid has passed out of your body in your poop. It is up to you to get the results of your test. Ask how to get your results when they are ready. Talk with your doctor about what your results mean. Follow these instructions at home:  Return to your normal diet as told by your doctor. Return to your normal activities when your doctor says that it is safe. Check your poop to make sure that it returns to normal color within a few days. If told, take actions to prevent trouble pooping and to help get the contrast liquid out of your body. You may need to: Drink enough fluid to keep your pee (urine) pale yellow. Take medicines. You will be told what medicines to take. Eat foods that are high in fiber. These include beans, whole grains, and fresh fruits and vegetables. Limit foods that are high in fat and sugar. These include fried or sweet foods. Contact a doctor if: You have trouble pooping for more than 2 days. Your poop still looks white or chalky after 3 days. You have cramps or pain. You have watery poop. You have swelling of your belly. You feel like you may vomit or you vomit. You have a fever. Get help right away if: You are not able to poop. You cannot pass gas. You have belly pain  that gets worse. You have itchy, red, swollen areas of skin (hives). Your throat swells. You have trouble breathing. You have a very hard and bloated (distended) belly. These symptoms may be an emergency. Get help right away. Call your local emergency services (911 in the [place] Do not wait to see if the symptoms will go away. Do not drive yourself to the hospital. Summary A small bowel series is an X-ray test. It is used to check for problems in the small bowel. For this test, you will drink a contrast liquid called barium. The liquid shows up well on X-rays and makes it easier for your doctor to see problems. After the test, your poop may be white or gray for 2-3 days until all the contrast liquid has passed out of your body. This information is not intended to replace advice given to you by your health care provider. Make sure you discuss any questions you have with your health care provider. Document Revised: 11/01/2020 Document Reviewed: 04/03/2020 Elsevier Patient Education  2024 [employer].

## 2023-10-28 NOTE — Progress Notes (Signed)
 Patient ID: Bryan Ruiz, male   DOB: 10/25/1975, 49 y.o.   MRN: [id number]  Chief Complaint: Posthospitalization follow-up for small bowel obstruction.  History of Present Illness Facundo Allemand is a 48 y.o. male with recent admission for small bowel obstruction, quickly resolved following NG tube decompression with restoration of bowel function.  Reports he is passing flatus, still irregular and bowel movements.  He reports he continues to avoid heavy meals, eating the light he calls it.  Utilizing MiraLAX  and supplementing with Benefiber sprinkled on his food. He denies any abdominal pain, cramping, nausea, vomiting, fevers or chills.  He appears well, he is wearing his work close from [employer].  Past Medical History Past Medical History:  Diagnosis Date   History of tinea cruris    History of vertigo 2009   once prior as of 11/2014    Onychomycosis       Past Surgical History:  Procedure Laterality Date   ANKLE SURGERY     EXCISION MASS HEAD N/A 08/24/2021   Procedure: EXCISION MASS FOREHEAD;  Surgeon: Elisabeth Craig RAMAN, MD;  [facility];  Service: Plastics;  Laterality: N/A;   VASECTOMY      No Known Allergies  Current Outpatient Medications  Medication Sig Dispense Refill   polyethylene glycol powder (GLYCOLAX /MIRALAX ) 17 GM/SCOOP powder Take 17 g by mouth 2 (two) times daily as needed. 765 g 0   No current facility-administered medications for this visit.    Family History Family History  Problem Relation Age of Onset   Diabetes Mother    Hypertension Mother    Hyperlipidemia Mother    Prostate cancer Father 67       surgery    Hypertension Father    Hyperlipidemia Father    Diabetes Maternal Grandmother    Kidney disease Maternal Grandmother    CVA Neg Hx    Colon cancer Neg Hx    Esophageal cancer Neg Hx    Rectal cancer Neg Hx    Stomach cancer Neg Hx       Social History Social History   Tobacco Use   Smoking status: Never    Smokeless tobacco: Never  Vaping Use   Vaping status: Never Used  Substance Use Topics   Alcohol use: No   Drug use: No        Review of Systems  Constitutional: Negative.   HENT: Negative.    Eyes: Negative.   Respiratory: Negative.    Cardiovascular: Negative.   Gastrointestinal: Negative.   Genitourinary: Negative.   Skin: Negative.   Neurological: Negative.   Psychiatric/Behavioral: Negative.       Physical Exam Blood pressure 105/67, pulse 83, temperature 98.4 F (36.9 C), temperature source Oral, height 5' 10 (1.778 m), weight 151 lb 9.6 oz (68.8 kg), SpO2 97%. Last Weight  Most recent update: 10/28/2023 10:21 AM    Weight  68.8 kg (151 lb 9.6 oz)             CONSTITUTIONAL: Well developed, and nourished, appropriately responsive and aware without distress.   EYES: Sclera non-icteric.   EARS, NOSE, MOUTH AND THROAT:  The oropharynx is clear. Oral mucosa is pink and moist.    Hearing is intact to voice.  NECK: Trachea is midline, and there is no jugular venous distension.  LYMPH NODES:  Lymph nodes in the neck are not appreciated. RESPIRATORY:  Lungs are clear, and breath sounds are equal bilaterally.  Normal respiratory effort without pathologic use of accessory  muscles. CARDIOVASCULAR: Heart is regular in rate and rhythm.   Well perfused.  GI: The abdomen is  soft, nontender, and nondistended. There were no palpable masses.  I did not appreciate hepatosplenomegaly. There were normal bowel sounds.   MUSCULOSKELETAL:  Symmetrical muscle tone appreciated in all four extremities.    SKIN: Skin turgor is normal. No pathologic skin lesions appreciated.  NEUROLOGIC:  Motor and sensation appear grossly normal.  Cranial nerves are grossly without defect. PSYCH:  Alert and oriented to person, place and time. Affect is appropriate for situation.  Data Reviewed I have personally reviewed what is currently available of the patient's imaging, recent labs and medical  records.   Labs:     Latest Ref Rng & Units 10/12/2023    4:20 AM 10/11/2023    7:49 PM 07/17/2023    2:21 PM  CBC  WBC 4.0 - 10.5 K/uL 8.8  11.7  4.3   Hemoglobin 13.0 - 17.0 g/dL 85.4  82.9  85.9   Hematocrit 39.0 - 52.0 % 42.4  48.6  40.3   Platelets 150 - 400 K/uL 203  235  185       Latest Ref Rng & Units 10/14/2023    3:49 AM 10/13/2023    5:03 AM 10/12/2023    4:20 AM  CMP  Glucose 70 - 99 mg/dL 892  95  881   BUN 6 - 20 mg/dL 10  17  16    Creatinine 0.61 - 1.24 mg/dL 8.99  8.93  9.13   Sodium 135 - 145 mmol/L 138  141  136   Potassium 3.5 - 5.1 mmol/L 3.5  3.8  3.7   Chloride 98 - 111 mmol/L 102  111  104   CO2 22 - 32 mmol/L 26  24  25    Calcium 8.9 - 10.3 mg/dL 8.5  8.0  8.2       Imaging: Radiological images personally reviewed:   Within last 24 hrs: No results found.  Assessment    History of small bowel obstruction, no evidence of persistence or recurrence. History of constipation. Patient Active Problem List   Diagnosis Date Noted   Hyponatremia 10/14/2023   Small bowel obstruction (HCC) 10/11/2023   Leukocytosis 10/11/2023   Injury of right shoulder 08/02/2022   Extensor tendonitis of foot 02/27/2022   Elevated lipids 07/03/2021   Body mass index (BMI) of 21.0 to 21.9 in adult 07/02/2021   Injury of triangular fibrocartilage complex of left wrist 06/19/2018   Onychomycosis    History of tinea cruris    History of vertigo 03/05/2007    Plan    Advised to pursue a goal of 25 to 30 g of fiber daily.  The majority of this may be through natural sources, advised to ensure a minimal daily fiber supplementation.  Various forms of supplements discussed.  Recommend Psyllium husk, with options of mixing with beverage or applesauce to make more tolerable. Strongly advised to consume more fluids(especially in proximity to fiber intake) and to ensure adequate hydration.   Watch color of urine to determine adequacy of hydration.  Clarity is pursued in urine output,  and bowel activity that responds and corresponds to significant meal intake.   We need to avoid deferring having bowel movements, advised to take the time at the first sign of sensation, typically following meals, and in the morning.  The need to avoid more frequently, and the presence of flatus may indicate the need for bowel movement.  Do not  defer for later.  Addition of MiraLAX  (or its generic equivalent) may be needed ensure at least twice daily bowel movements.  If multiple doses of MiraLAX  are necessary utilize them. Never skip a day... Do not tolerate a day without a bowel movement unless you are fasting.  To be regular, we must do the above EVERY day.   Soluble Fiber Dissolves in Water: Soluble fiber dissolves in water to form a gel-like substance. Slows Digestion: This type of fiber slows down digestion, which can help control blood sugar levels and lower cholesterol. Sources: Common sources include oats, beans, apples, citrus fruits, and psyllium. Benefits: Helps manage cholesterol levels. Aids in blood sugar control. Increases healthy gut bacteria, which can lower inflammation and improve digestion.  Insoluble Fiber Does Not Dissolve in Water: Insoluble fiber does not dissolve in water and remains mostly intact as it passes through the digestive system. Adds Bulk to Stool: It adds bulk to stool, which helps promote regular bowel movements and prevent constipation. Sources: Common sources include whole grains, nuts, beans, and vegetables like cauliflower and potatoes. Benefits: Improves bowel health and regularity. Reduces the risk of colorectal conditions like hemorrhoids and diverticulitis. Supports insulin sensitivity in people with diabetes.  Both types of fiber are essential for overall health, and it's beneficial to include a 50/50 mix of both in your diet.   This was discussed at length, with high emphasis made in maintaining excellent hydration.   He can follow-up with  us  as needed.  We remain readily available.    I personally spent a total of 30 minutes in the care of the patient today including preparing to see the patient, getting/reviewing separately obtained history, counseling and educating, and documenting clinical information in the EHR.   These notes generated with voice recognition software. I apologize for typographical errors.  Honor Leghorn M.D., FACS 10/28/2023, 11:07 AM

## 2024-02-18 ENCOUNTER — Encounter: Payer: Self-pay | Admitting: Nurse Practitioner

## 2024-02-18 ENCOUNTER — Ambulatory Visit: Payer: Self-pay | Admitting: Nurse Practitioner

## 2024-02-18 VITALS — BP 110/72 | HR 86 | Ht 69.0 in | Wt 159.2 lb

## 2024-02-18 DIAGNOSIS — B351 Tinea unguium: Secondary | ICD-10-CM | POA: Diagnosis not present

## 2024-02-18 DIAGNOSIS — Z0000 Encounter for general adult medical examination without abnormal findings: Secondary | ICD-10-CM

## 2024-02-18 DIAGNOSIS — Z1329 Encounter for screening for other suspected endocrine disorder: Secondary | ICD-10-CM | POA: Diagnosis not present

## 2024-02-18 DIAGNOSIS — R531 Weakness: Secondary | ICD-10-CM

## 2024-02-18 DIAGNOSIS — Z59819 Housing instability, housed unspecified: Secondary | ICD-10-CM | POA: Diagnosis not present

## 2024-02-18 DIAGNOSIS — R519 Headache, unspecified: Secondary | ICD-10-CM

## 2024-02-18 DIAGNOSIS — E785 Hyperlipidemia, unspecified: Secondary | ICD-10-CM | POA: Diagnosis not present

## 2024-02-18 DIAGNOSIS — R42 Dizziness and giddiness: Secondary | ICD-10-CM

## 2024-02-18 DIAGNOSIS — G8929 Other chronic pain: Secondary | ICD-10-CM | POA: Diagnosis not present

## 2024-02-18 DIAGNOSIS — Z130 Encounter for screening for diseases of the blood and blood-forming organs and certain disorders involving the immune mechanism: Secondary | ICD-10-CM | POA: Diagnosis not present

## 2024-02-18 DIAGNOSIS — G44219 Episodic tension-type headache, not intractable: Secondary | ICD-10-CM

## 2024-02-18 DIAGNOSIS — Z1321 Encounter for screening for nutritional disorder: Secondary | ICD-10-CM | POA: Diagnosis not present

## 2024-02-18 LAB — LIPID PANEL

## 2024-02-18 MED ORDER — ITRACONAZOLE 100 MG PO CAPS: 100.0000 mg | ORAL_CAPSULE | Freq: Every day | ORAL | 0 refills | Status: AC

## 2024-02-18 NOTE — Assessment & Plan Note (Addendum)
 Repeat labs today for monitoring. Recommend low fat, high fiber diet options and routine exercise for management.

## 2024-02-18 NOTE — Patient Instructions (Addendum)
 We will check your labs today to see if we can find a cause for the fatigue, weakness, and headaches you have been having. Sometimes this can be something like low iron or vitamins/minerals.   There are no preventive care reminders to display for this patient.   For all adult patients, I recommend A well balanced diet low in saturated fats, cholesterol, and moderation in carbohydrates.   This can be as simple as monitoring portion sizes and cutting back on sugary beverages such as soda and juice to start with.    Daily water consumption of at least 64 ounces.  Physical activity at least 180 minutes per week, if just starting out.   This can be as simple as taking the stairs instead of the elevator and walking 2-3 laps around the office  purposefully every day.   STD protection, partner selection, and regular testing if high risk.  Limited consumption of alcoholic beverages if alcohol is consumed.  For women, I recommend no more than 7 alcoholic beverages per week, spread out throughout the week.  Avoid binge drinking or consuming large quantities of alcohol in one setting.   Please let me know if you feel you may need help with reduction or quitting alcohol consumption.   Avoidance of nicotine, if used.  Please let me know if you feel you may need help with reduction or quitting nicotine use.   Daily mental health attention.  This can be in the form of 5 minute daily meditation, prayer, journaling, yoga, reflection, etc.   Purposeful attention to your emotions and mental state can significantly improve your overall wellbeing  and  Health.  Please know that I am here to help you with all of your health care goals and am happy to work with you to find a solution that works best for you.  The greatest advice I have received with any changes in life are to take it one step at a time, that even means if all you can focus on is the next 60 seconds, then do that and celebrate your victories.   With any changes in life, you will have set backs, and that is OK. The important thing to remember is, if you have a set back, it is not a failure, it is an opportunity to try again!  Health Maintenance Recommendations Screening Testing Mammogram Every 1 -2 years based on history and risk factors Starting at age 1 Pap Smear Ages 21-39 every 3 years Ages 31-65 every 5 years with HPV testing More frequent testing may be required based on results and history Colon Cancer Screening Every 1-10 years based on test performed, risk factors, and history Starting at age 33 Bone Density Screening Every 2-10 years based on history Starting at age 46 for women Recommendations for men differ based on medication usage, history, and risk factors AAA Screening One time ultrasound Men 72-40 years old who have every smoked Lung Cancer Screening Low Dose Lung CT every 12 months Age 72-80 years with a 30 pack-year smoking history who still smoke or who have quit within the last 15 years  Screening Labs Routine  Labs: Complete Blood Count (CBC), Complete Metabolic Panel (CMP), Cholesterol (Lipid Panel) Every 6-12 months based on history and medications May be recommended more frequently based on current conditions or previous results Hemoglobin A1c Lab Every 3-12 months based on history and previous results Starting at age 48 or earlier with diagnosis of diabetes, high cholesterol, BMI >26, and/or risk factors  Frequent monitoring for patients with diabetes to ensure blood sugar control Thyroid Panel (TSH w/ T3 & T4) Every 6 months based on history, symptoms, and risk factors May be repeated more often if on medication HIV One time testing for all patients 58 and older May be repeated more frequently for patients with increased risk factors or exposure Hepatitis C One time testing for all patients 64 and older May be repeated more frequently for patients with increased risk factors or  exposure Gonorrhea, Chlamydia Every 12 months for all sexually active persons 13-24 years Additional monitoring may be recommended for those who are considered high risk or who have symptoms PSA Men 62-58 years old with risk factors Additional screening may be recommended from age 39-69 based on risk factors, symptoms, and history  Vaccine Recommendations Tetanus Booster All adults every 10 years Flu Vaccine All patients 6 months and older every year COVID Vaccine All patients 12 years and older Initial dosing with booster May recommend additional booster based on age and health history HPV Vaccine 2 doses all patients age 26-26 Dosing may be considered for patients over 26 Shingles Vaccine (Shingrix) 2 doses all adults 55 years and older Pneumonia (Pneumovax 23) All adults 65 years and older May recommend earlier dosing based on health history Pneumonia (Prevnar 26) All adults 65 years and older Dosed 1 year after Pneumovax 23  Additional Screening, Testing, and Vaccinations may be recommended on an individualized basis based on family history, health history, risk factors, and/or exposure.

## 2024-02-18 NOTE — Assessment & Plan Note (Addendum)
 Etiology unknown. Examination today is normal. Consider possible deficiency as a contributor. Will monitor labs today. No alarm symptoms present.  Orders:   CBC with Differential/Platelet   Vitamin B12   Comprehensive metabolic panel with GFR   Iron, TIBC and Ferritin Panel   TSH   Lipid panel   VITAMIN D  25 Hydroxy (Vit-D Deficiency, Fractures)

## 2024-02-18 NOTE — Progress Notes (Signed)
 Bryan Doing, DNP, AGNP-c [facility] [address] [place], [place] [address] Main Office [phone] VISIT TYPE: CPE on 02/18/2024 Today's Vitals   02/18/24 0956  BP: 110/72  Pulse: 86  Weight: 159 lb 3.2 oz (72.2 kg)  Height: 5' 9 (1.753 m)   Body mass index is 23.51 kg/m.  Wt Readings from Last 3 Encounters:  02/18/24 159 lb 3.2 oz (72.2 kg)  10/28/23 151 lb 9.6 oz (68.8 kg)  10/27/23 153 lb 6.4 oz (69.6 kg)     Subjective:  Annual Exam (CPE fasting labs, was in the hospital about 2 months ago small intestine blockage and has felt weaker since then muscle weakness, )   Bryan Ruiz is a 48 year old male presenting today for his annual physical exam.   He expresses concerns with a headache he has noted in the temple region on several mornings after awakening. He reports the pain improves as he stretches his neck, but this is not something he has had before. He has had the headache on Saturday and "Sunday of the last two weeks and once more in the middle of the week. He has not taken anything for this and it did resolve. The pain was not severe in nature, but was uncomfortable. He denies any neck or shoulder pain, but does admit some tightness in the left shoulder area.   He also reports concerns for ongoing weakness in his muscles and easily fatigued since his hospitalization in August for SBO. He reports that he has not had any additional concerns with his bowels since that time and has been working to drink a protein shake about every other day.   He also reports concerns with dizziness when standing quickly or moving from a bended/crouched to standing position. The dizziness resolves fairly quickly on it's own, but this is new.   He reports that the fungal growth on some of his toenails has resolved with the use of fluconazole, but he has had an increase in symptoms in the great toes of both feet. He has tried terbinafine oral treatment without success and has  used fluconazole on and off for quite some time for the treatment of his toes. He has been dosing the fluconazole once a week for several months without resolution with the great toenails. He does feel that they were better, but now appear worse.   He is active at work and tries to follow a balanced diet. He does report some concerns with food and housing insecurity recently. He agrees to evaluation by the chronic care management team to see if there are assistive options available.   Pertinent items are noted in HPI.     12" /17/2025    9:54 AM 10/27/2023   10:52 AM 02/08/2022    2:10 PM 11/12/2021    2:47 PM 07/02/2021    1:55 PM  Depression screen PHQ 2/9  Decreased Interest 0 0 0 0 0  Down, Depressed, Hopeless 0 0 0 0 0  PHQ - 2 Score 0 0 0 0 0  Altered sleeping 0      Tired, decreased energy 1      Change in appetite 0      Feeling bad or failure about yourself  0      Trouble concentrating 0      Moving slowly or fidgety/restless 0      Suicidal thoughts 0      PHQ-9 Score 1      Difficult Ruiz work/chores Not  difficult at all            No data to display             02/18/2024    9:54 AM 10/27/2023   10:52 AM 02/08/2022    2:30 PM 02/08/2022    2:11 PM 11/12/2021    2:46 PM  Fall Risk   Falls in the past year? 0 0 0  0  Number falls in past yr: 0 0 0 0 0  Injury with Fall? 0 0  0  0  0   Risk for fall due to : No Fall Risks No Fall Risks  No Fall Risks No Fall Risks  Follow up Falls evaluation completed Falls evaluation completed   Falls evaluation completed      Data saved with a previous flowsheet row definition   Past medical history, surgical history, medications, allergies, family history and social history reviewed with patient today and changes made to appropriate areas of the chart.      Objective:    Physical Exam Vitals and nursing note reviewed.  Constitutional:      Appearance: Normal appearance. He is well-developed.  HENT:     Head:  Normocephalic and atraumatic.     Right Ear: Tympanic membrane and external ear normal.     Left Ear: Tympanic membrane and external ear normal.     Nose: Nose normal.     Mouth/Throat:     Mouth: Mucous membranes are moist.     Pharynx: Oropharynx is clear.  Eyes:     Conjunctiva/sclera: Conjunctivae normal.     Pupils: Pupils are equal, round, and reactive to light.  Neck:     Thyroid: No thyromegaly.     Vascular: No carotid bruit.  Cardiovascular:     Rate and Rhythm: Normal rate and regular rhythm.     Pulses: Normal pulses.     Heart sounds: Normal heart sounds.  Pulmonary:     Effort: Pulmonary effort is normal.     Breath sounds: Normal breath sounds.  Abdominal:     General: Bowel sounds are normal. There is no distension.     Palpations: Abdomen is soft. There is no mass.     Tenderness: There is no abdominal tenderness. There is no right CVA tenderness, left CVA tenderness, guarding or rebound.  Musculoskeletal:       Arms:     Cervical back: Normal range of motion and neck supple. No rigidity.     Right lower leg: No edema.     Left lower leg: No edema.     Comments: Tightness noted in the muscle. ROM appears intact.   Lymphadenopathy:     Cervical: No cervical adenopathy.  Skin:    General: Skin is warm and dry.     Capillary Refill: Capillary refill takes less than 2 seconds.  Neurological:     Mental Status: He is alert and oriented to person, place, and time.     Cranial Nerves: No cranial nerve deficit.     Sensory: No sensory deficit.     Coordination: Coordination normal.     Gait: Gait normal.     Deep Tendon Reflexes: Reflexes are normal and symmetric.  Psychiatric:        Mood and Affect: Mood normal.         Assessment & Plan:   Assessment & Plan Encounter for annual physical exam CPE completed today. Review of HM activities and recommendations discussed  and provided on AVS. Anticipatory guidance, diet, and exercise recommendations  provided. Medications, allergies, and hx reviewed and updated as necessary. Orders placed as listed below.  Plan: - Labs ordered. Will make changes as necessary based on results.  - I will review these results and send recommendations via MyChart or a telephone call.  - F/U with CPE in 1 year or sooner for acute/chronic health needs as directed.      Chronic intractable headache, unspecified headache type  Orders:   CBC with Differential/Platelet   Vitamin B12   Comprehensive metabolic panel with GFR   Iron, TIBC and Ferritin Panel   TSH   Lipid panel   VITAMIN D  25 Hydroxy (Vit-D Deficiency, Fractures)  Toenail fungus Ongoing concern with previous treatment with terbinafine  and fluconazole  ineffective for complete management. Great toes are still affected. Will transition medication to itraconazole  with daily dosing for 3 months to see if this is helpful with management. Patient instructed to contact the office if the medication is not affordable. Consider soaking feet daily in warm water with epsom salts and a small amount of white vinegar to help with fungal component. Wash and dry feet thoroughly before putting on socks.  Orders:   itraconazole  (SPORANOX ) 100 MG capsule; Take 1 capsule (100 mg total) by mouth daily.   CBC with Differential/Platelet   Vitamin B12   Comprehensive metabolic panel with GFR   Iron, TIBC and Ferritin Panel   TSH   Lipid panel   VITAMIN D  25 Hydroxy (Vit-D Deficiency, Fractures)  Weakness Etiology unknown. Examination today is normal. Consider possible deficiency as a contributor. Will monitor labs today. No alarm symptoms present.  Orders:   CBC with Differential/Platelet   Vitamin B12   Comprehensive metabolic panel with GFR   Iron, TIBC and Ferritin Panel   TSH   Lipid panel   VITAMIN D  25 Hydroxy (Vit-D Deficiency, Fractures)  Orthostatic dizziness Exam normal today. Suspect orthostatic hypotension present as his BP is on the lower end of  normal. We will rule out concerns with iron or other electrolyte deficiency today. If labs WNL we can consider cardiac monitor. No signs of abnormal rhythm while in office.  Orders:   CBC with Differential/Platelet   Vitamin B12   Comprehensive metabolic panel with GFR   Iron, TIBC and Ferritin Panel   TSH   Lipid panel   VITAMIN D  25 Hydroxy (Vit-D Deficiency, Fractures)  Screening for endocrine, nutritional, metabolic and immunity disorder  Orders:   CBC with Differential/Platelet   Vitamin B12   Comprehensive metabolic panel with GFR   Iron, TIBC and Ferritin Panel   TSH   Lipid panel   VITAMIN D  25 Hydroxy (Vit-D Deficiency, Fractures)  Housing insecurity  Orders:   AMB Referral [facility] Care Management  Episodic tension-type headache, not intractable Muscle tension present in the shoulders with intermittent AM headache. We discussed that this could be from sleeping position with neck or stress/tension. I do recommend he continue with the stretching exercises. We will also ensure that labs are normal and no contributing. No alarm symptoms are present at this time. Recommend f/u if symptoms worsen or if any new symptoms present.     Elevated lipids Repeat labs today for monitoring. Recommend low fat, high fiber diet options and routine exercise for management.      NEXT PREVENTATIVE PHYSICAL DUE IN 1 YEAR.    PATIENT COUNSELING PROVIDED FOR ALL ADULT PATIENTS: A well balanced diet low in saturated fats, cholesterol,  and moderation in carbohydrates.  This can be as simple as monitoring portion sizes and cutting back on sugary beverages such as soda and juice to start with.    Daily water consumption of at least 64 ounces.  Physical activity at least 180 minutes per week.  If just starting out, start 10 minutes a day and work your way up.   This can be as simple as taking the stairs instead of the elevator and walking 2-3 laps around the office  purposefully every day.    STD protection, partner selection, and regular testing if high risk.  Limited consumption of alcoholic beverages if alcohol is consumed. For men, I recommend no more than 14 alcoholic beverages per week, spread out throughout the week (max 2 per day). Avoid binge drinking or consuming large quantities of alcohol in one setting.  Please let me know if you feel you may need help with reduction or quitting alcohol consumption.   Avoidance of nicotine, if used. Please let me know if you feel you may need help with reduction or quitting nicotine use.   Daily mental health attention. This can be in the form of 5 minute daily meditation, prayer, journaling, yoga, reflection, etc.  Purposeful attention to your emotions and mental state can significantly improve your overall wellbeing  and  Health.  Please know that I am here to help you with all of your health care goals and am happy to work with you to find a solution that works best for you.  The greatest advice I have received with any changes in life are to take it one step at a time, that even means if all you can focus on is the next 60 seconds, then do that and celebrate your victories.  With any changes in life, you will have set backs, and that is OK. The important thing to remember is, if you have a set back, it is not a failure, it is an opportunity to try again! Screening Testing Mammogram Every 1 -2 years based on history and risk factors Starting at age 27 Pap Smear Ages 21-39 every 3 years Ages 75-65 every 5 years with HPV testing More frequent testing may be required based on results and history Colon Cancer Screening Every 1-10 years based on test performed, risk factors, and history Starting at age 31 Bone Density Screening Every 2-10 years based on history Starting at age 80 for women Recommendations for men differ based on medication usage, history, and risk factors AAA Screening One time ultrasound Men 28-75 years  old who have every smoked Lung Cancer Screening Low Dose Lung CT every 12 months Age 82-80 years with a 30 pack-year smoking history who still smoke or who have quit within the last 15 years   Screening Labs Routine  Labs: Complete Blood Count (CBC), Complete Metabolic Panel (CMP), Cholesterol (Lipid Panel) Every 6-12 months based on history and medications May be recommended more frequently based on current conditions or previous results Hemoglobin A1c Lab Every 3-12 months based on history and previous results Starting at age 40 or earlier with diagnosis of diabetes, high cholesterol, BMI >26, and/or risk factors Frequent monitoring for patients with diabetes to ensure blood sugar control Thyroid Panel (TSH) Every 6 months based on history, symptoms, and risk factors May be repeated more often if on medication HIV One time testing for all patients 62 and older May be repeated more frequently for patients with increased risk factors or exposure Hepatitis C One  time testing for all patients 18 and older May be repeated more frequently for patients with increased risk factors or exposure Gonorrhea, Chlamydia Every 12 months for all sexually active persons 13-24 years Additional monitoring may be recommended for those who are considered high risk or who have symptoms Every 12 months for any woman on birth control, regardless of sexual activity PSA Men 94-94 years old with risk factors Additional screening may be recommended from age 39-69 based on risk factors, symptoms, and history  Vaccine Recommendations Tetanus Booster All adults every 10 years Flu Vaccine All patients 6 months and older every year COVID Vaccine All patients 12 years and older Initial dosing with booster May recommend additional booster based on age and health history HPV Vaccine 2 doses all patients age 30-26 Dosing may be considered for patients over 26 Shingles Vaccine (Shingrix) 2 doses all adults 55  years and older Pneumonia (Pneumovax 3) All adults 65 years and older May recommend earlier dosing based on health history One year apart from Prevnar 35 Pneumonia (Prevnar 15) All adults 65 years and older Dosed 1 year after Pneumovax 23 Pneumonia (Prevnar 20) One time alternative to the two dosing of 13 and 23 For all adults with initial dose of 23, 20 is recommended 1 year later For all adults with initial dose of 13, 23 is still recommended as second option 1 year later

## 2024-02-18 NOTE — Assessment & Plan Note (Addendum)

## 2024-02-18 NOTE — Assessment & Plan Note (Addendum)
 Ongoing concern with previous treatment with terbinafine  and fluconazole  ineffective for complete management. Great toes are still affected. Will transition medication to itraconazole  with daily dosing for 3 months to see if this is helpful with management. Patient instructed to contact the office if the medication is not affordable. Consider soaking feet daily in warm water with epsom salts and a small amount of white vinegar to help with fungal component. Wash and dry feet thoroughly before putting on socks.  Orders:   itraconazole  (SPORANOX ) 100 MG capsule; Take 1 capsule (100 mg total) by mouth daily.   CBC with Differential/Platelet   Vitamin B12   Comprehensive metabolic panel with GFR   Iron, TIBC and Ferritin Panel   TSH   Lipid panel   VITAMIN D  25 Hydroxy (Vit-D Deficiency, Fractures)

## 2024-02-18 NOTE — Assessment & Plan Note (Addendum)
 Exam normal today. Suspect orthostatic hypotension present as his BP is on the lower end of normal. We will rule out concerns with iron or other electrolyte deficiency today. If labs WNL we can consider cardiac monitor. No signs of abnormal rhythm while in office.  Orders:   CBC with Differential/Platelet   Vitamin B12   Comprehensive metabolic panel with GFR   Iron, TIBC and Ferritin Panel   TSH   Lipid panel   VITAMIN D  25 Hydroxy (Vit-D Deficiency, Fractures)

## 2024-02-18 NOTE — Assessment & Plan Note (Addendum)
°  Orders:   CBC with Differential/Platelet   Vitamin B12   Comprehensive metabolic panel with GFR   Iron, TIBC and Ferritin Panel   TSH   Lipid panel   VITAMIN D  25 Hydroxy (Vit-D Deficiency, Fractures)

## 2024-02-18 NOTE — Assessment & Plan Note (Signed)
 Muscle tension present in the shoulders with intermittent AM headache. We discussed that this could be from sleeping position with neck or stress/tension. I do recommend he continue with the stretching exercises. We will also ensure that labs are normal and no contributing. No alarm symptoms are present at this time. Recommend f/u if symptoms worsen or if any new symptoms present.

## 2024-02-19 LAB — CBC WITH DIFFERENTIAL/PLATELET
Basophils Absolute: 0.1 x10E3/uL (ref 0.0–0.2)
Basos: 2 %
EOS (ABSOLUTE): 0.4 x10E3/uL (ref 0.0–0.4)
Eos: 11 %
Hematocrit: 44.8 % (ref 37.5–51.0)
Hemoglobin: 14.8 g/dL (ref 13.0–17.7)
Immature Grans (Abs): 0 x10E3/uL (ref 0.0–0.1)
Immature Granulocytes: 0 %
Lymphocytes Absolute: 1.4 x10E3/uL (ref 0.7–3.1)
Lymphs: 40 %
MCH: 29.6 pg (ref 26.6–33.0)
MCHC: 33 g/dL (ref 31.5–35.7)
MCV: 90 fL (ref 79–97)
Monocytes Absolute: 0.4 x10E3/uL (ref 0.1–0.9)
Monocytes: 11 %
Neutrophils Absolute: 1.2 x10E3/uL — ABNORMAL LOW (ref 1.4–7.0)
Neutrophils: 36 %
Platelets: 190 x10E3/uL (ref 150–450)
RBC: 5 x10E6/uL (ref 4.14–5.80)
RDW: 11.9 % (ref 11.6–15.4)
WBC: 3.4 x10E3/uL (ref 3.4–10.8)

## 2024-02-19 LAB — LIPID PANEL
Cholesterol, Total: 182 mg/dL (ref 100–199)
HDL: 44 mg/dL (ref >39)
LDL CALC COMMENT:: 4.1 ratio (ref 0.0–5.0)
LDL Chol Calc (NIH): 124 mg/dL — AB (ref 0–99)
Triglycerides: 72 mg/dL (ref 0–149)
VLDL Cholesterol Cal: 14 mg/dL (ref 5–40)

## 2024-02-19 LAB — COMPREHENSIVE METABOLIC PANEL WITH GFR
ALT: 17 IU/L (ref 0–44)
AST: 15 IU/L (ref 0–40)
Albumin: 4.5 g/dL (ref 4.1–5.1)
Alkaline Phosphatase: 36 IU/L — AB (ref 47–123)
BUN/Creatinine Ratio: 10 (ref 9–20)
BUN: 11 mg/dL (ref 6–24)
Bilirubin Total: 1.6 mg/dL — AB (ref 0.0–1.2)
CO2: 26 mmol/L (ref 20–29)
Calcium: 9.3 mg/dL (ref 8.7–10.2)
Chloride: 101 mmol/L (ref 96–106)
Creatinine, Ser: 1.14 mg/dL (ref 0.76–1.27)
Globulin, Total: 2.8 g/dL (ref 1.5–4.5)
Glucose: 80 mg/dL (ref 70–99)
Potassium: 4.5 mmol/L (ref 3.5–5.2)
Sodium: 140 mmol/L (ref 134–144)
Total Protein: 7.3 g/dL (ref 6.0–8.5)
eGFR: 80 mL/min/1.73 (ref >59)

## 2024-02-19 LAB — IRON,TIBC AND FERRITIN PANEL
Ferritin: 262 ng/mL (ref 30–400)
Iron Saturation: 45 % (ref 15–55)
Iron: 136 ug/dL (ref 38–169)
Total Iron Binding Capacity: 304 ug/dL (ref 250–450)
UIBC: 168 ug/dL (ref 111–343)

## 2024-02-19 LAB — VITAMIN B12: Vitamin B-12: 642 pg/mL (ref 232–1245)

## 2024-02-19 LAB — TSH: TSH: 0.636 u[IU]/mL (ref 0.450–4.500)

## 2024-02-19 LAB — VITAMIN D 25 HYDROXY (VIT D DEFICIENCY, FRACTURES): Vit D, 25-Hydroxy: 29.2 ng/mL — AB (ref 30.0–100.0)

## 2024-02-25 ENCOUNTER — Ambulatory Visit: Payer: Self-pay | Admitting: Nurse Practitioner

## 2024-02-25 DIAGNOSIS — R17 Unspecified jaundice: Secondary | ICD-10-CM

## 2024-02-25 LAB — LACTATE DEHYDROGENASE: LDH: 128 IU/L (ref 121–224)

## 2024-02-25 LAB — SPECIMEN STATUS REPORT

## 2024-03-01 IMAGING — CT CT HEAD W/O CM
4 series · 15 of 47 positions shown, 17 images · non-contrast
Comparison: CT brain 02/05/2019 and 07/01/2014

CLINICAL DATA: Lump on side of forehead for 20 years. Irritated
with hats and scar. Pressure to area can cause headache. Bone mass
or bone pain on skull.



[Series 2: head wo · axial · 0.42mm/px · z∈[+1316,+1431]mm · 7 of 31 slices shown, 9 images]
[im 4/31  brain]
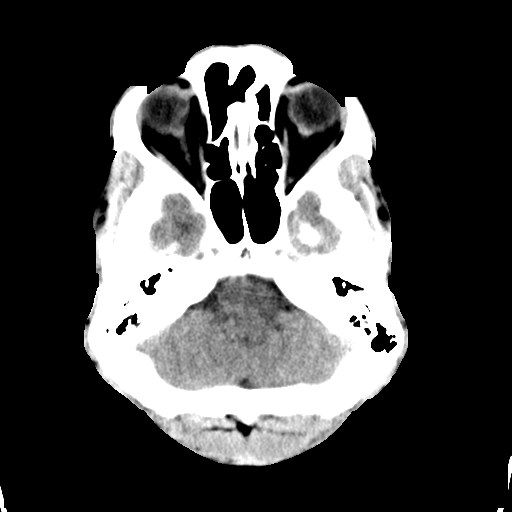
[im 4/31  bone]
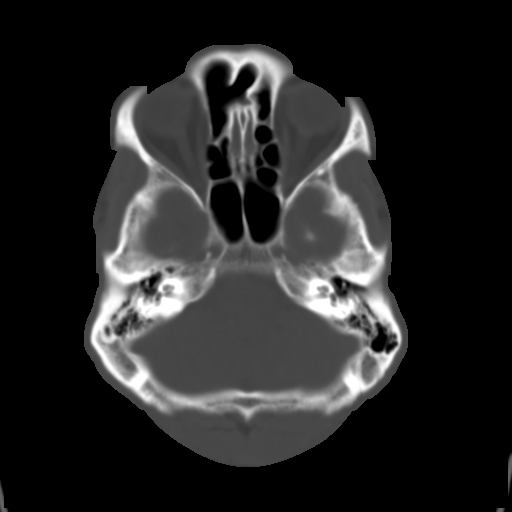
[im 8/31  brain]
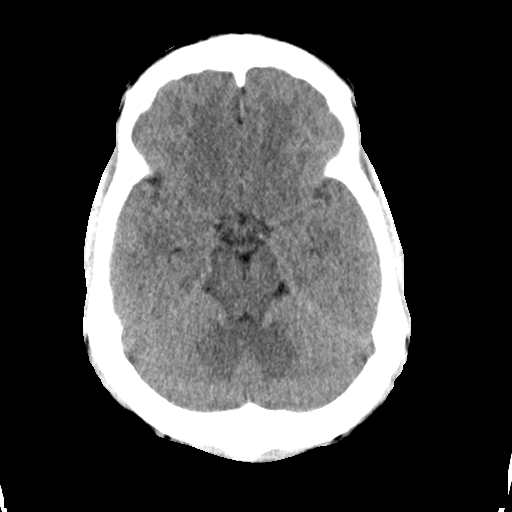
[im 12/31  brain]
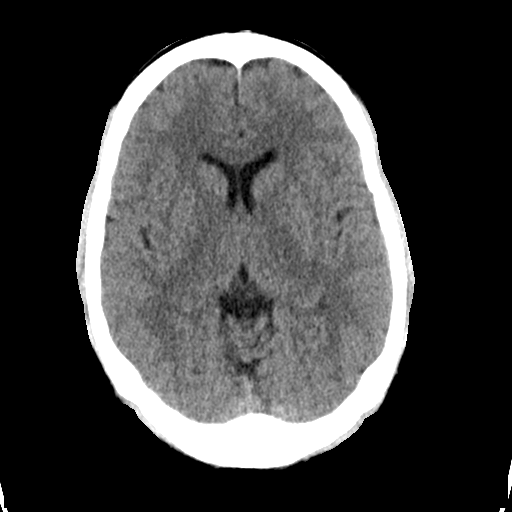
[im 16/31  brain]
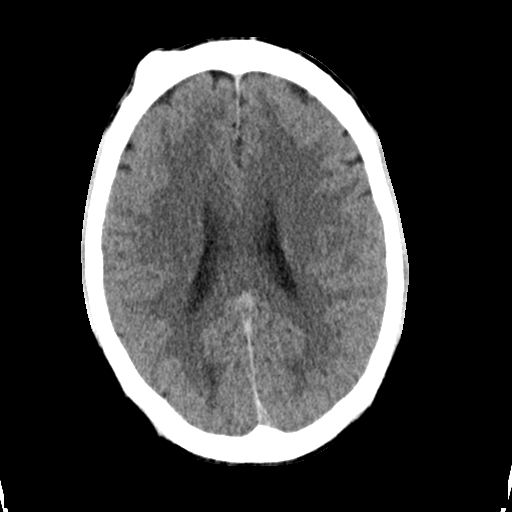
[im 19/31  brain]
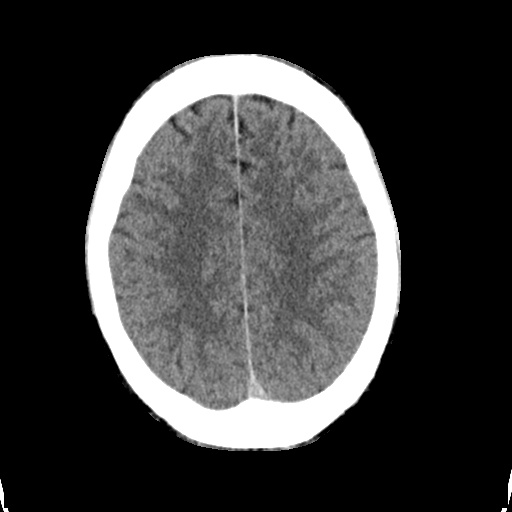
[im 19/31  bone]
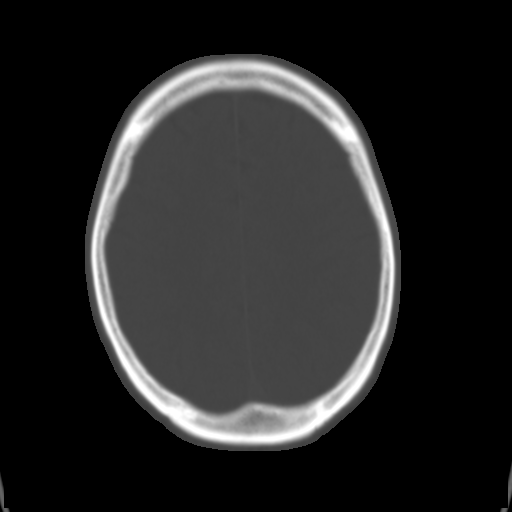
[im 23/31  brain]
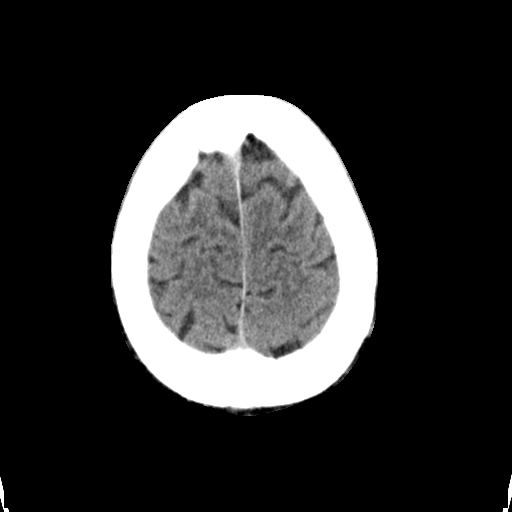
[im 27/31  brain]
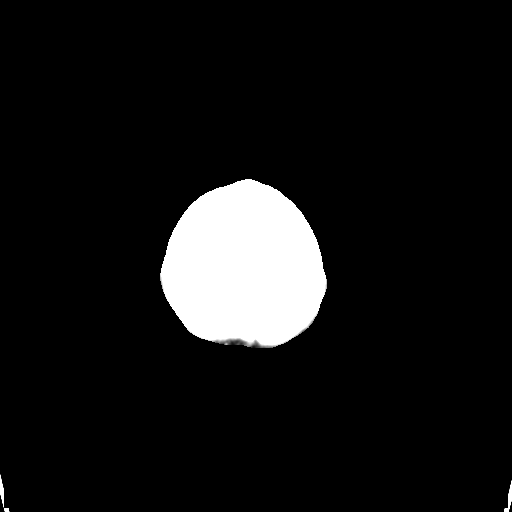

[Series 3: head bone · axial · 0.42mm/px · z∈[+1315,+1331]mm · 2 of 77 slices shown]
[im 8/77  bone]
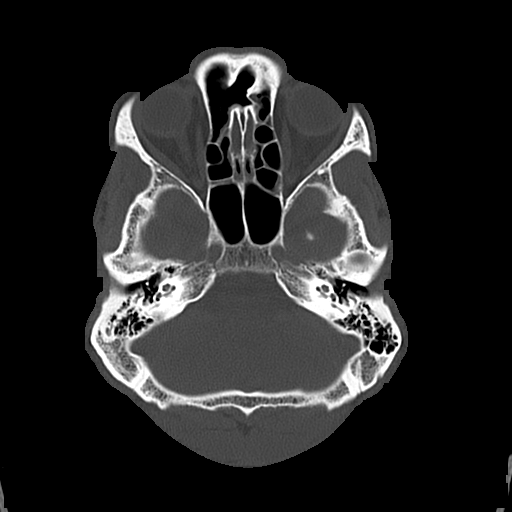
[im 16/77  bone]
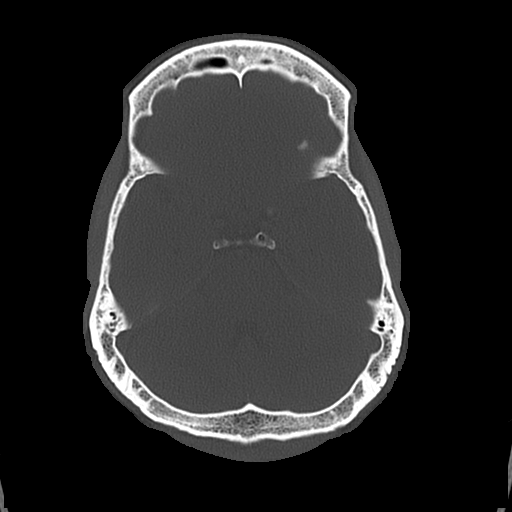

[Series 4: coronal soft · coronal · 0.29mm/px · 3 of 66 slices shown]
[im 22/66  brain]
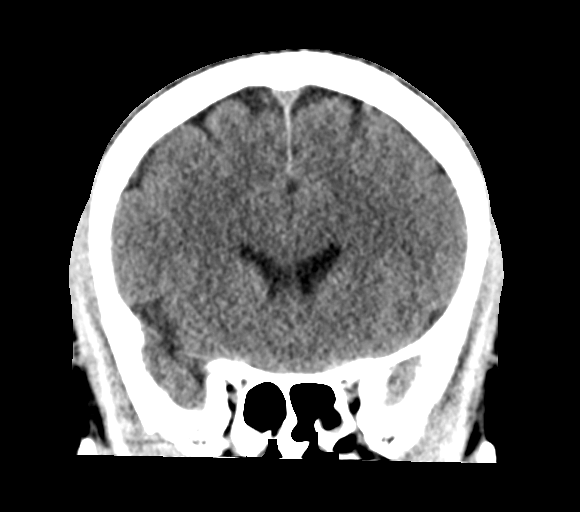
[im 29/66  brain]
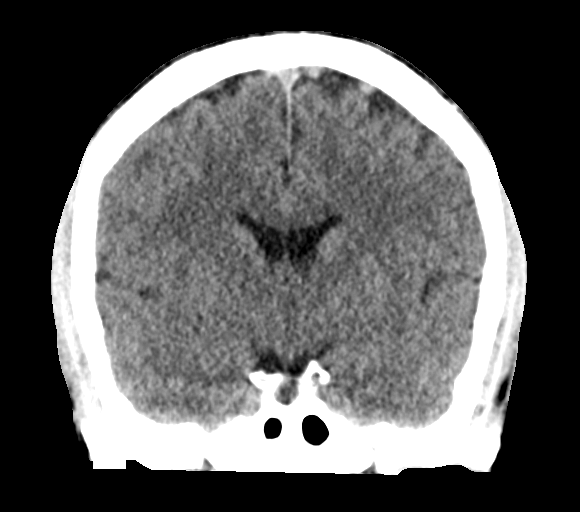
[im 37/66  brain]
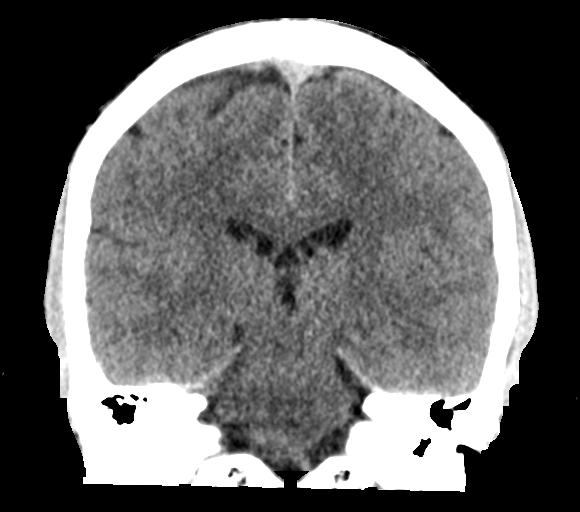

[Series 5: sagittal soft · sagittal · 0.29mm/px · 3 of 57 slices shown]
[im 19/57  brain]
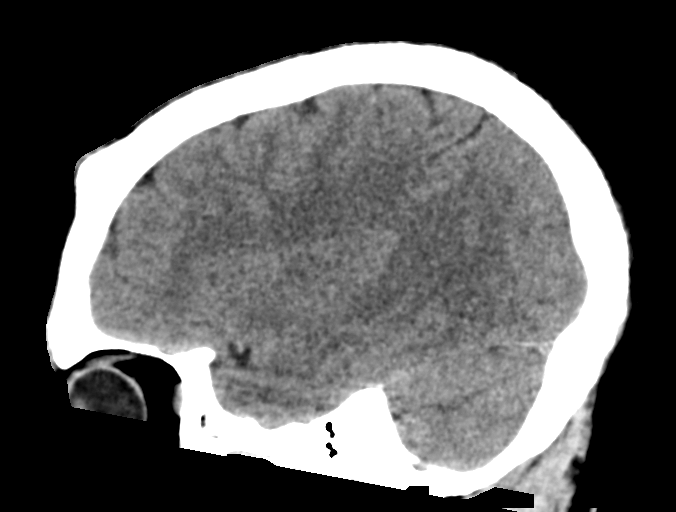
[im 29/57  brain]
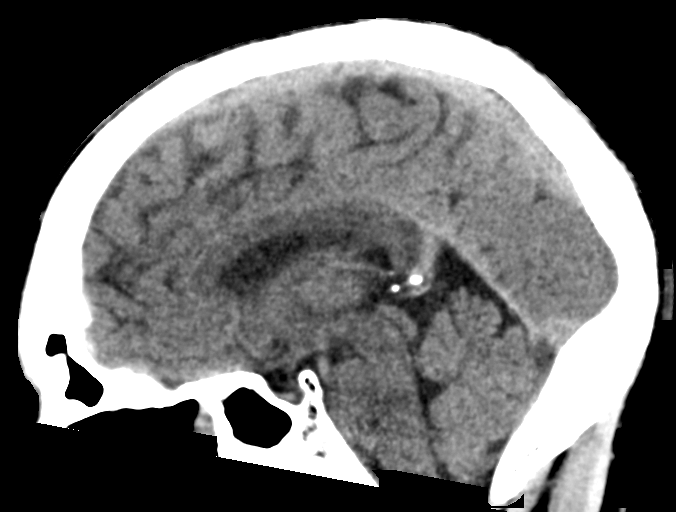
[im 38/57  brain]
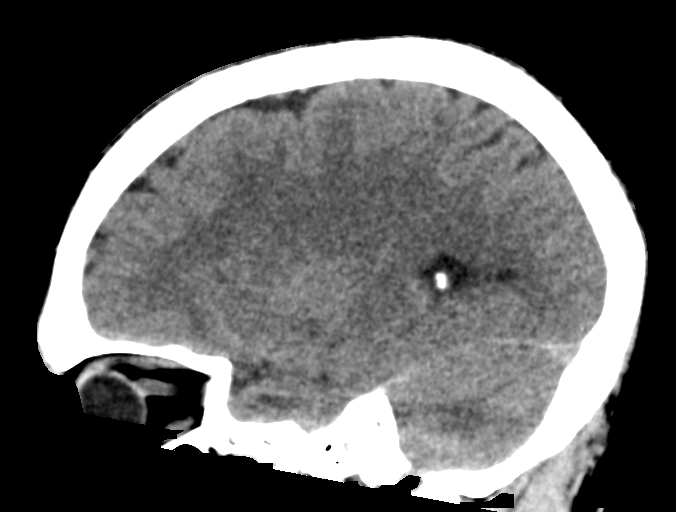

[15 of 47 positions shown; findings below may reference images not displayed]

FINDINGS: Brain: The ventricles are normal in size and configuration. The
basilar cisterns are patent.

No mass, mass effect, or midline shift.

No acute intracranial hemorrhage is seen.

No abnormal extra-axial fluid collection.

Preservation of the normal cortical gray-white interface without CT
evidence of an acute major vascular territorial cortical based
infarction.

Vascular: No hyperdense vessel or unexpected calcification.

Skull: There is a marker overlying the right forehead in the region
of the previously seen sclerotic bone lesion with smooth margins
external to the outer table of the skull. This measures up to
approximately 2.4 x 0.8 by 1.6 cm (transverse by AP by craniocaudal,
not significantly changed from 02/05/2019 when measured in a similar
manner.

There is again a smaller similar smooth sclerotic bone lesion
measuring up to 9 x 2 x 7 mm (transverse by AP by craniocaudal) also
unchanged from 02/05/2019. Both of these appear slightly increased
from more remote 07/01/2014 CT.

Sinuses/Orbits: The visualized orbits are unremarkable.
Mild-to-moderate mucosal thickening of the ethmoid air cells
diffusely, similar to prior. The visualized mastoid air cells are
clear.

Other: None.
IMPRESSION: 1. No significant change in right-greater-than-left frontal skull
outer table sclerotic smooth lesions most suggestive of benign
osteomas. These are stable from 02/05/2019 and again mildly
increased in size from 07/01/2014.
2. Mild-to-moderate ethmoid air cell mucosal opacification similar
to prior.
3. No acute intracranial process.

## 2024-03-09 ENCOUNTER — Telehealth: Payer: Self-pay

## 2024-03-09 NOTE — Progress Notes (Signed)
 Complex Care Management Note Care Guide Note  03/09/2024 Name: Bryan Ruiz MRN: [id number] DOB: Apr 18, 1975   Complex Care Management Outreach Attempts: An unsuccessful telephone outreach was attempted today to offer the patient information about available complex care management services.  Follow Up Plan:  Additional outreach attempts will be made to offer the patient complex care management information and services.   Encounter Outcome:  No Answer-No Voicemail  Leotis Rase [facility], [facility] Guide  Direct Dial: [phone]  Fax [phone]

## 2024-03-10 NOTE — Progress Notes (Signed)
 Complex Care Management Note Care Guide Note  03/10/2024 Name: Bryan Ruiz MRN: [id number] DOB: 01/30/1976   Complex Care Management Outreach Attempts: A second unsuccessful outreach was attempted today to offer the patient with information about available complex care management services.  Follow Up Plan:  Additional outreach attempts will be made to offer the patient complex care management information and services.   Encounter Outcome:  No Answer-Left voicemail  Leotis Rase [facility], [facility] Guide  Direct Dial: [phone]  Fax [phone]

## 2024-03-11 NOTE — Progress Notes (Signed)
 Complex Care Management Note Care Guide Note  03/11/2024 Name: Bryan Ruiz MRN: [id number] DOB: October 08, 1975   Complex Care Management Outreach Attempts: A third unsuccessful outreach was attempted today to offer the patient with information about available complex care management services.  Follow Up Plan:  No further outreach attempts will be made at this time. We have been unable to contact the patient to offer or enroll patient in complex care management services.  Encounter Outcome:  No Answer-Left voicemail  Leotis Rase [facility], [facility] Guide  Direct Dial: [phone]  Fax [phone]

## 2025-02-21 ENCOUNTER — Encounter: Admitting: Nurse Practitioner
